# Patient Record
Sex: Male | Born: 1940 | Race: White | Hispanic: No | Marital: Married
Health system: Midwestern US, Community
[De-identification: ages and names within clinical notes are randomized; demographics above are authoritative.]

## PROBLEM LIST (undated history)

## (undated) DIAGNOSIS — C4490 Unspecified malignant neoplasm of skin, unspecified: Secondary | ICD-10-CM

## (undated) DIAGNOSIS — Z22322 Carrier or suspected carrier of Methicillin resistant Staphylococcus aureus: Secondary | ICD-10-CM

## (undated) DIAGNOSIS — G4730 Sleep apnea, unspecified: Secondary | ICD-10-CM

## (undated) DIAGNOSIS — M1990 Unspecified osteoarthritis, unspecified site: Secondary | ICD-10-CM

## (undated) DIAGNOSIS — E7800 Pure hypercholesterolemia, unspecified: Secondary | ICD-10-CM

## (undated) DIAGNOSIS — L0291 Cutaneous abscess, unspecified: Secondary | ICD-10-CM

## (undated) DIAGNOSIS — H269 Unspecified cataract: Secondary | ICD-10-CM

## (undated) DIAGNOSIS — I10 Essential (primary) hypertension: Secondary | ICD-10-CM

## (undated) DIAGNOSIS — Z923 Personal history of irradiation: Secondary | ICD-10-CM

## (undated) DIAGNOSIS — Z86010 Personal history of colonic polyps: Secondary | ICD-10-CM

## (undated) DIAGNOSIS — L02419 Cutaneous abscess of limb, unspecified: Secondary | ICD-10-CM

## (undated) HISTORY — DX: Cutaneous abscess of limb, unspecified: L02.419

## (undated) HISTORY — DX: Sleep apnea, unspecified: G47.30

## (undated) HISTORY — DX: Personal history of irradiation: Z92.3

## (undated) HISTORY — PX: EYE SURGERY: SHX253

## (undated) HISTORY — PX: APPENDECTOMY: SHX54

## (undated) HISTORY — DX: Pure hypercholesterolemia, unspecified: E78.00

## (undated) HISTORY — DX: Personal history of colonic polyps: Z86.010

## (undated) HISTORY — DX: Unspecified cataract: H26.9

## (undated) HISTORY — PX: BACK SURGERY: SHX140

## (undated) HISTORY — PX: COLONOSCOPY: SHX174

## (undated) HISTORY — DX: Morbid (severe) obesity due to excess calories: E66.01

## (undated) HISTORY — DX: Unspecified osteoarthritis, unspecified site: M19.90

## (undated) HISTORY — PX: REPLACEMENT TOTAL KNEE: SUR1224

## (undated) HISTORY — PX: TONSILLECTOMY: SUR1361

---

## 2002-03-16 ENCOUNTER — Encounter: Payer: Self-pay | Admitting: Emergency Medicine

## 2002-03-16 ENCOUNTER — Emergency Department (HOSPITAL_COMMUNITY): Admission: EM | Admit: 2002-03-16 | Discharge: 2002-03-16 | Payer: Self-pay

## 2002-08-10 ENCOUNTER — Encounter: Payer: Self-pay | Admitting: Family Medicine

## 2002-08-10 ENCOUNTER — Encounter: Admission: RE | Admit: 2002-08-10 | Discharge: 2002-08-10 | Payer: Self-pay | Admitting: Family Medicine

## 2002-09-01 ENCOUNTER — Ambulatory Visit (HOSPITAL_BASED_OUTPATIENT_CLINIC_OR_DEPARTMENT_OTHER): Admission: RE | Admit: 2002-09-01 | Discharge: 2002-09-01 | Payer: Self-pay | Admitting: Family Medicine

## 2003-07-15 ENCOUNTER — Encounter: Admission: RE | Admit: 2003-07-15 | Discharge: 2003-07-15 | Payer: Self-pay | Admitting: Family Medicine

## 2004-02-22 ENCOUNTER — Ambulatory Visit: Payer: Self-pay | Admitting: Internal Medicine

## 2004-03-05 ENCOUNTER — Ambulatory Visit: Payer: Self-pay | Admitting: Internal Medicine

## 2004-09-23 ENCOUNTER — Emergency Department (HOSPITAL_COMMUNITY): Admission: EM | Admit: 2004-09-23 | Discharge: 2004-09-23 | Payer: Self-pay | Admitting: Emergency Medicine

## 2004-10-26 ENCOUNTER — Ambulatory Visit (HOSPITAL_COMMUNITY): Admission: RE | Admit: 2004-10-26 | Discharge: 2004-10-26 | Payer: Self-pay | Admitting: Orthopedic Surgery

## 2004-10-26 ENCOUNTER — Ambulatory Visit (HOSPITAL_BASED_OUTPATIENT_CLINIC_OR_DEPARTMENT_OTHER): Admission: RE | Admit: 2004-10-26 | Discharge: 2004-10-26 | Payer: Self-pay | Admitting: Orthopedic Surgery

## 2004-12-12 HISTORY — PX: KNEE ARTHROPLASTY: SHX992

## 2004-12-24 ENCOUNTER — Inpatient Hospital Stay (HOSPITAL_COMMUNITY): Admission: RE | Admit: 2004-12-24 | Discharge: 2004-12-28 | Payer: Self-pay | Admitting: Orthopedic Surgery

## 2005-05-08 ENCOUNTER — Encounter: Admission: RE | Admit: 2005-05-08 | Discharge: 2005-05-08 | Payer: Self-pay | Admitting: Family Medicine

## 2005-05-10 ENCOUNTER — Encounter: Admission: RE | Admit: 2005-05-10 | Discharge: 2005-05-10 | Payer: Self-pay | Admitting: Family Medicine

## 2006-08-05 ENCOUNTER — Encounter: Admission: RE | Admit: 2006-08-05 | Discharge: 2006-08-05 | Payer: Self-pay | Admitting: Family Medicine

## 2006-09-03 IMAGING — CR DG ABDOMEN 1V
2 series · 2 of 2 positions shown · non-contrast
Comparison: None.

CLINICAL DATA: Ileus and constipation after left knee surgery.
 SINGLE VIEW ABDOMEN:

[view not recorded (1 of 2)]
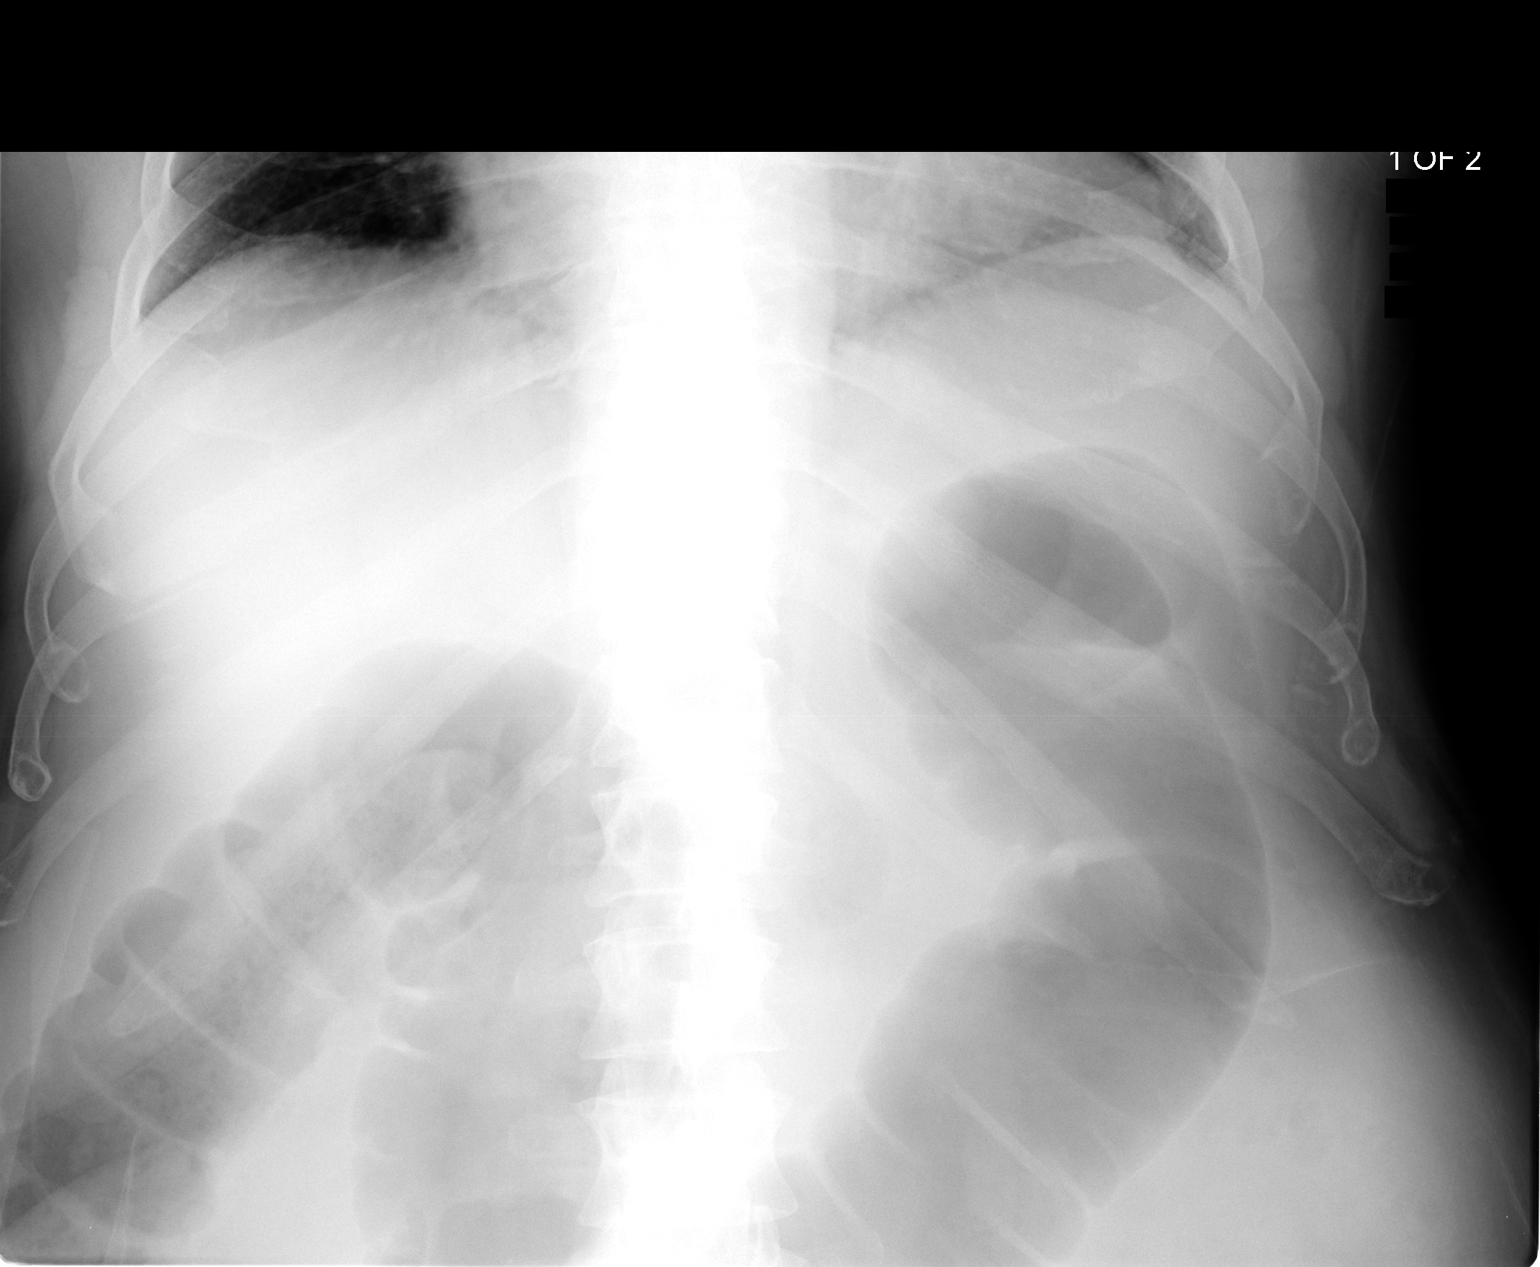

[view not recorded (2 of 2)]
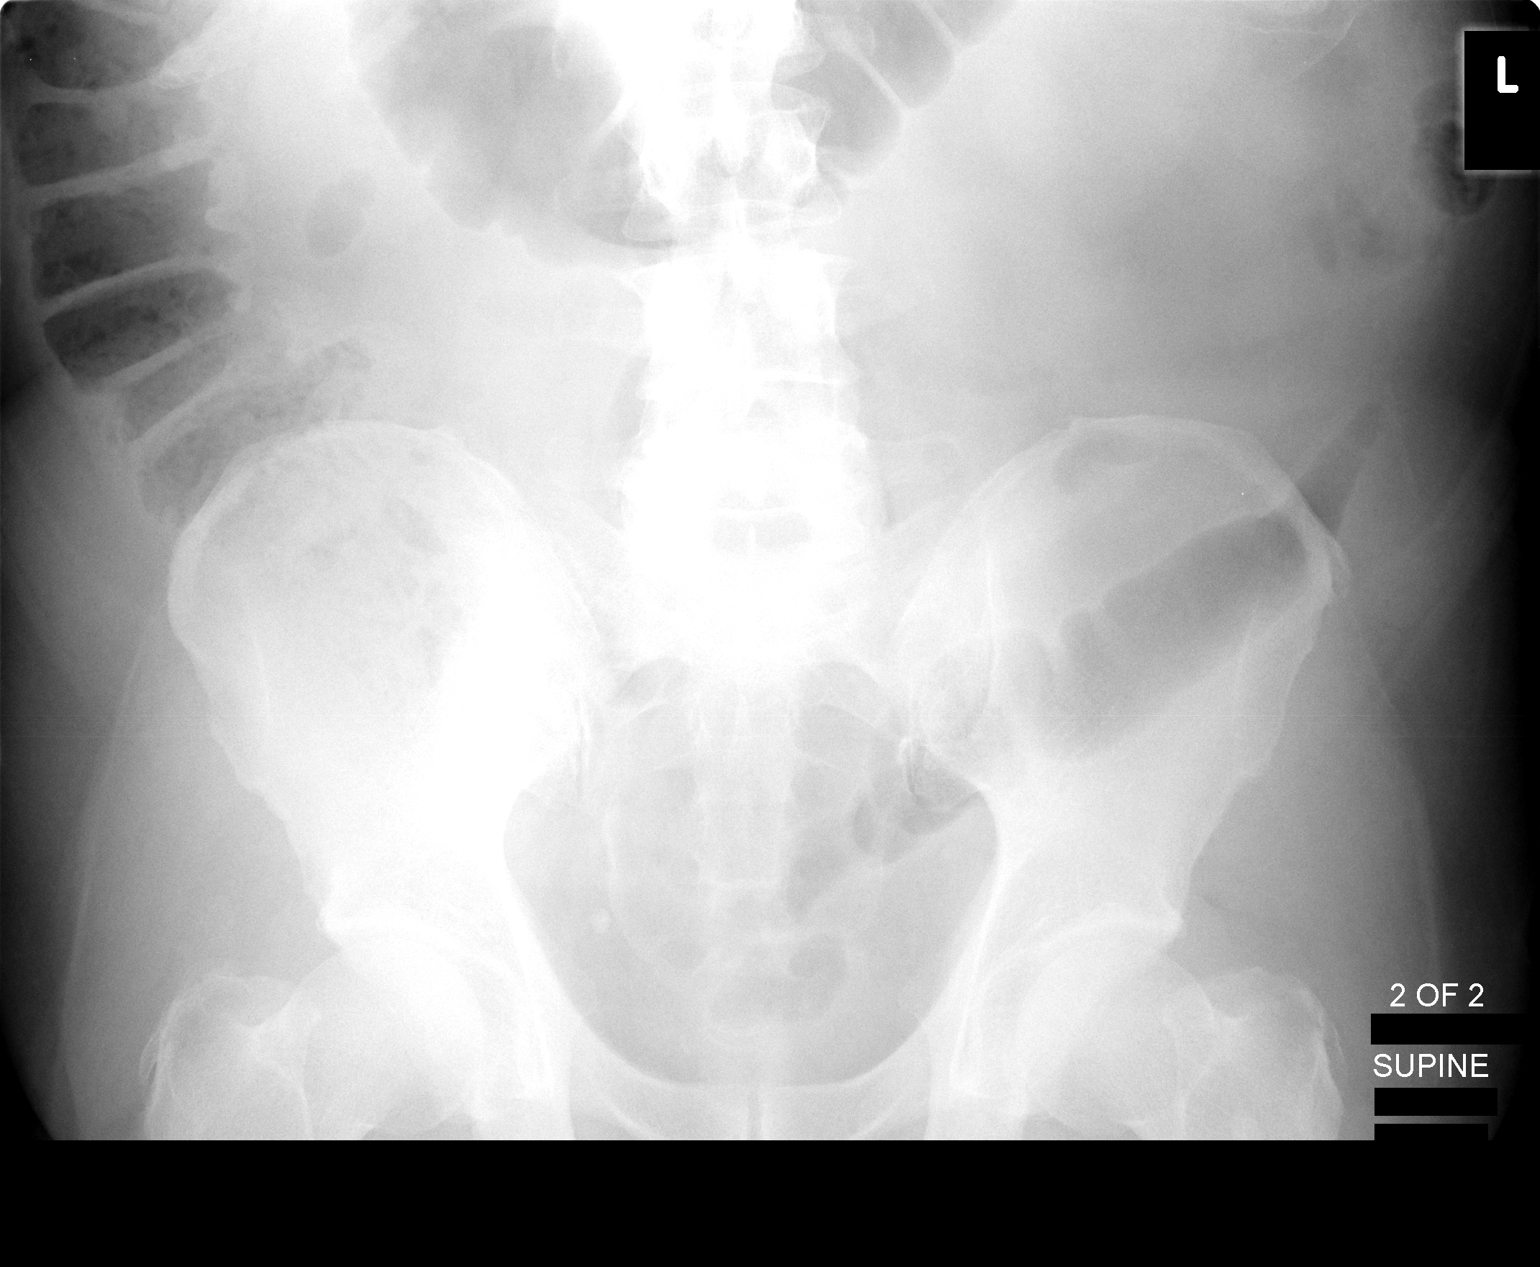

[2 of 2 positions shown; findings below may reference images not displayed]

FINDINGS: Supine films were obtained portably to encompass the entire abdomen.  There is moderate gaseous dilatation of the colon consistent with ileus.  No small bowel dilatation is seen.
IMPRESSION: Moderate postoperative colonic ileus.

## 2006-12-17 ENCOUNTER — Inpatient Hospital Stay (HOSPITAL_COMMUNITY): Admission: RE | Admit: 2006-12-17 | Discharge: 2006-12-23 | Payer: Self-pay | Admitting: Neurosurgery

## 2008-05-18 ENCOUNTER — Encounter: Admission: RE | Admit: 2008-05-18 | Discharge: 2008-05-18 | Payer: Self-pay | Admitting: Family Medicine

## 2008-08-23 IMAGING — CR DG LUMBAR SPINE 1V
1 series · 1 of 1 positions shown · non-contrast
Comparison: none

CLINICAL DATA: L4-5 discectomy.  
 PORTABLE LATERAL LUMBAR SPINE ? 1 VIEW:

[view not recorded]
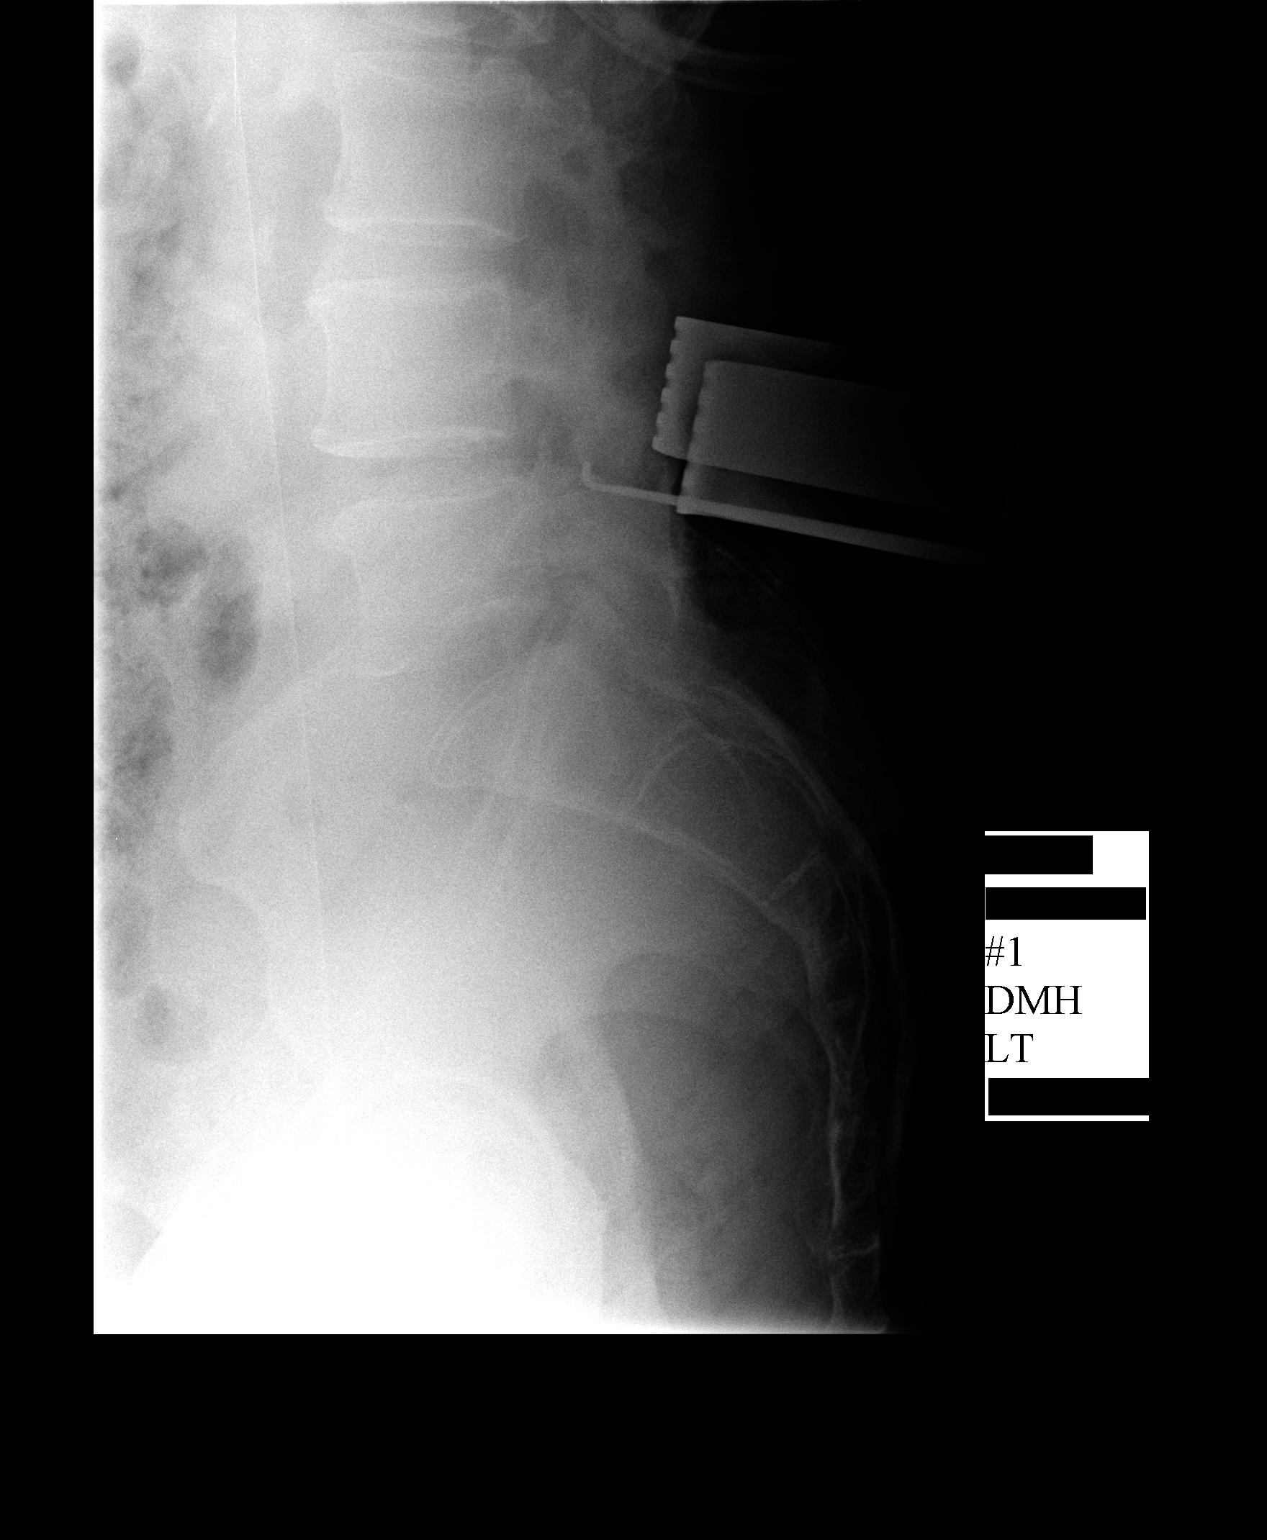

[1 of 1 positions shown; findings below may reference images not displayed]

FINDINGS: A single film shows tissue spreaders in place posteriorly with the probe directed at the L4-4 disc space level.
IMPRESSION: As discussed above.

## 2009-02-11 HISTORY — PX: ROTATOR CUFF REPAIR: SHX139

## 2009-02-18 ENCOUNTER — Emergency Department (HOSPITAL_COMMUNITY): Admission: EM | Admit: 2009-02-18 | Discharge: 2009-02-19 | Payer: Self-pay | Admitting: Emergency Medicine

## 2009-03-11 ENCOUNTER — Encounter: Admission: RE | Admit: 2009-03-11 | Discharge: 2009-03-11 | Payer: Self-pay | Admitting: Orthopedic Surgery

## 2009-05-11 ENCOUNTER — Ambulatory Visit (HOSPITAL_COMMUNITY): Admission: RE | Admit: 2009-05-11 | Discharge: 2009-05-12 | Payer: Self-pay | Admitting: Orthopedic Surgery

## 2010-05-02 LAB — CBC
MCV: 89.4 fL (ref 78.0–100.0)
Platelets: 164 10*3/uL (ref 150–400)
WBC: 9.4 10*3/uL (ref 4.0–10.5)

## 2010-05-02 LAB — BASIC METABOLIC PANEL
Chloride: 102 mEq/L (ref 96–112)
GFR calc non Af Amer: >60 mL/min (ref >60)
Potassium: 3.5 mEq/L (ref 3.5–5.1)
Sodium: 135 mEq/L (ref 135–145)

## 2010-05-02 LAB — GLUCOSE, CAPILLARY: Glucose-Capillary: 159 mg/dL — ABNORMAL HIGH (ref 70–99)

## 2010-05-06 LAB — URINALYSIS, ROUTINE W REFLEX MICROSCOPIC
Bilirubin Urine: NEGATIVE
Glucose, UA: NEGATIVE mg/dL
Hgb urine dipstick: NEGATIVE
Ketones, ur: NEGATIVE mg/dL
Nitrite: NEGATIVE
Specific Gravity, Urine: 1.022 (ref 1.005–1.030)
pH: 5.5 (ref 5.0–8.0)

## 2010-05-06 LAB — CBC
HCT: 43.9 % (ref 39.0–52.0)
Hemoglobin: 14.7 g/dL (ref 13.0–17.0)
RBC: 4.84 MIL/uL (ref 4.22–5.81)

## 2010-05-06 LAB — BASIC METABOLIC PANEL
BUN: 14 mg/dL (ref 6–23)
CO2: 25 mEq/L (ref 19–32)
Calcium: 9.5 mg/dL (ref 8.4–10.5)
Chloride: 107 mEq/L (ref 96–112)
GFR calc Af Amer: >60 mL/min (ref >60)
GFR calc non Af Amer: >60 mL/min (ref >60)
Glucose, Bld: 144 mg/dL — ABNORMAL HIGH (ref 70–99)
Potassium: 4 mEq/L (ref 3.5–5.1)

## 2010-05-06 LAB — PROTIME-INR
INR: 0.94 (ref 0.00–1.49)
Prothrombin Time: 12.5 seconds (ref 11.6–15.2)

## 2010-05-06 LAB — GLUCOSE, CAPILLARY: Glucose-Capillary: 151 mg/dL — ABNORMAL HIGH (ref 70–99)

## 2010-05-06 LAB — DIFFERENTIAL
Lymphs Abs: 2.1 10*3/uL (ref 0.7–4.0)
Monocytes Absolute: 0.9 10*3/uL (ref 0.1–1.0)
Neutrophils Relative %: 60 % (ref 43–77)

## 2010-05-06 LAB — APTT: aPTT: 26 seconds (ref 24–37)

## 2010-06-26 NOTE — Discharge Summary (Signed)
Figueroa, Jerry               ACCOUNT NO.:  [id number]   MEDICAL RECORD NO.:  [id number]          PATIENT TYPE:  INP   LOCATION:  3001                         FACILITY:  [facility]   PHYSICIAN:  Hilda Lias, M.D.   DATE OF BIRTH:  02/11/1941   DATE OF ADMISSION:  12/17/2006  DATE OF DISCHARGE:  12/18/2006                               DISCHARGE SUMMARY   ADMISSION DIAGNOSIS:  L4-L5 spondylolisthesis with chronic  radiculopathy.   FINAL DIAGNOSIS:  L4-L5 spondylolisthesis with chronic radiculopathy.   CLINICAL HISTORY:  The patient was admitted because with radiation to  both legs.  X-rays show a spondylolisthesis at the L4-5.  Previously  this gentleman was to have surgery by somebody else.   HOSPITAL COURSE:  Normal course in the hospital.  The patient was taken  to surgery for an L4-5 diskectomy, followed by fusion was done.  Today,  he is feeling much better.  He has been ambulating.  He is ready to go  home.   CONDITION ON DISCHARGE:  Improving.   MEDICATIONS:  Percocet and  Diazepam.   DIET:  Regular.   ACTIVITY:  Not to do any heavy lifting.  No to drive for, at least, 2  weeks.   FOLLOWUP:  He will be seen by me in my office in 4 weeks or before as  needed.           ______________________________  Hilda Lias, M.D.     EB/MEDQ  D:  12/23/2006  T:  12/23/2006  Job:  [id number]

## 2010-06-26 NOTE — H&P (Signed)
Jerry Figueroa, PRUDE               ACCOUNT NO.:  [id number]   MEDICAL RECORD NO.:  [id number]          PATIENT TYPE:  INP   LOCATION:  3001                         FACILITY:  [facility]   PHYSICIAN:  Hilda Lias, M.D.   DATE OF BIRTH:  Jun 29, 1940   DATE OF ADMISSION:  12/17/2006  DATE OF DISCHARGE:                              HISTORY & PHYSICAL   HISTORY OF PRESENT ILLNESS:  Mr. Landin is a gentleman who came to see  me about 8 weeks ago complaining of back pain with radiation to the left  leg, which has been going on for almost 6 months.  The pain is mostly  associated with mobility.  When he stands up the pain shoots all the way  down the to the left leg.  The patient was seen by a new surgeon who  advised him to have an effusion.  Nevertheless, he came to [place] for a  second opinion and he wants [place] to proceed with surgery.   PAST MEDICAL HISTORY:  1. Eye surgery.  2. Tonsillectomy.  3. Knee replacement.  4. Appendectomy.   SOCIAL HISTORY:  He drinks socially.  He does not smoke.   FAMILY HISTORY:  Negative.   REVIEW OF SYSTEMS:  Positive for back pain, leg pain, diabetes and high  blood pressure.   PHYSICAL EXAMINATION:  GENERAL:  The patient came to my office and he  was walking without flexion of the lumbar spine to minimize the pain.  He was holding the left leg with the left arm.  He said it was quite  painful.  HEENT:  Normal.  NECK:  Normal.  LUNGS:  Clear.  HEART:  Sounds normal.  ABDOMEN:  Normal.  EXTREMITIES:  Normal pulses.  NEURO:  Shows that he has weakness of the left leg at the level of the  dorsiflexion and difficult to assess his strength because of the pain  that he is having.  Straight leg raising (SLR)is positive bilateral  around 60 degrees.   The MRI showed that he has grade 1 spondylolisthesis at the level 4 and  5, which gets worse with flexion.   CLINICAL IMPRESSION:  L4-5 spondylolisthesis grade 1 with stenosis.   RECOMMENDATIONS:  The  patient is being admitted for surgery.  The  procedure would be an L4-5 diskectomy, interbody fusion, pedicle screws  with BMP and autograft.The patient told about the risks of infection,  CSF leak, worsening pain, damage to the vessels and the need for further  surgery and failure of the hardware.           ______________________________  Hilda Lias, M.D.     EB/MEDQ  D:  12/17/2006  T:  12/17/2006  Job:  [id number]

## 2010-06-26 NOTE — Op Note (Signed)
NAMEGOTTFRIED, STANDISH               ACCOUNT NO.:  [id number]   MEDICAL RECORD NO.:  [id number]          PATIENT TYPE:  INP   LOCATION:  3172                         FACILITY:  [facility]   PHYSICIAN:  Hilda Lias, M.D.   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   PROCEDURE:  Bilateral L4 laminectomy, facetectomy, diskectomy  bilaterally, interbody fusion with cage, pedicle screws L4-5,  posterolateral arthrodesis with bone morphogenic protein and autograft.  Cell Saver, C-arm.   SURGEON:  Hilda Lias, M.D.   ASSISTANT:  Clydene Fake, M.D.   CLINICAL HISTORY:  The patient had been complaining back pain with  radiation to both legs, left worse than right one.  This problem had  been going on for several months.  X-rays showed that he has stenosis at  the level of 4-5 with a herniated disk at 4-5 to the left.  Surgery was  advised.  The risks were explained to him in history.   PROCEDURE:  The patient was taken to the OR and he was positioned in  prone manner.  The skin was cleaned with DuraPrep.  Drapes were applied.  A midline incision between 4-5 was made.  Muscles were retracted  laterally until we found the junction between the facet and transverse  process of 4-5.  X-rays showed that indeed we were at the 4-5.  From  then on we removed the spinous process of L4 and we did a bilateral  laminectomy.  We did a partial facetectomy, but to enter into the disk  space, we had to do a complete facetectomy to reach laterally the disk.  Having done this in the right side we incised the disk and large amount  degenerative disk was removed.  In the left side we  found the same  finding but there was a herniated disk subligamentous  going into the  nerve of five.  Removal was achieved.  After having good diskectomy the  endplate were removed.  Then two cages of 12 x 26 with BMP and autograft  inside were inserted.  The rest of the disk was packed with BMP distally  and proximally to the dura mater autograft.  Then with the C-arm first  in AP view and then a lateral view, we identified the pedicle of L4, L5.  The pedicle were probed and a tap was used and in the end we were able  to insert four pedicle screws of 6.5 x 50.  There was kept in place  using a rod with caps on top.  Then we went laterally and we removed  periosteum of the 4-5 facet laterally as well as the transverse process.  A mix of BMP and autograft was used for the fusion.  From then on the  area was irrigated.  Valsalva maneuver was negative.  The  wound was  closed with Vicryl and Steri-Strips.           ______________________________  Hilda Lias, M.D.    EB/MEDQ  D:  12/17/2006  T:  12/18/2006  Job:  [id number]

## 2010-06-29 NOTE — Op Note (Signed)
[name], Jerry Figueroa               ACCOUNT NO.:  [id number]   MEDICAL RECORD NO.:  [id number]          PATIENT TYPE:  AMB   LOCATION:  NESC                         FACILITY:  [facility]   PHYSICIAN:  Marlowe Kays, M.D.  DATE OF BIRTH:  1941-01-14   DATE OF PROCEDURE:  10/26/2004  DATE OF DISCHARGE:                                 OPERATIVE REPORT   PREOPERATIVE DIAGNOSES:  1.  Torn medial meniscus.  2.  Osteoarthritis, right knee.   POSTOPERATIVE DIAGNOSES:  1.  Torn medial meniscus.  2.  Osteoarthritis, right knee.   OPERATION:  Right knee arthroscopy with:  1.  Partial medial meniscectomy.  2.  Debridement of the medial femoral condyle and the patella.  3.  Removal of loose body, medial joint.   SURGEON:  Dr. Simonne Come   ASSISTANT:  Nurse.   ANESTHESIA:  General.   PATHOLOGY AND JUSTIFICATION FOR PROCEDURE:  Because of pain and swelling in  his right knee and the feeling of some locking, he had an MRI performed on  October 02, 2004 which showed an oblique tear reaching the undersurface and  the body of the medial meniscus as well as osteoarthritis medially and the  patellofemoral joint.  These findings were all confirmed at surgery as well  as a loose body in the anterior medial joint.   PROCEDURE:  Satisfactory general anesthesia, pneumatic tourniquet.  Leg was  esmarched nonsterilely.  Thigh stabilizer.  Right leg was prepped with  DuraPrep from stabilizer to ankle with DuraPrep and draped in a sterile  field.  Ace wrap and leg support for left knee.  Superior medial saline  inflow.  First through an anterolateral portal of the medial compartment the  knee joint was evaluated.  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  All these were managed by using the 3.5 shaver to  perform synovectomy, remove the loose body, and debride down the medial  femoral condyle.   Posteriorly the fairly extensive medial meniscus tear I  managed with resection with small baskets and then shaving down until smooth  with a 3.5 shaver until the meniscus was nice and smooth and stable.  Looking up in the medial gutter and suprapatellar area, some wear on the  patella was appreciated and shaved.  I then reversed portals.  Looking in  the lateral joint, there was some mild synovitis which I resected.  The  meniscus looked normal.  The knee joint was then irrigated until clear and  all fluid possible removed.  The anterior portals were closed with 4-0  nylon, and 20 mL of 0.5% Marcaine with adrenalin and 4 mg of morphine were  then instilled through the inflow  apparatus which was removed and this portal closed with 4-0 nylon as well.  Betadine, Adaptic, dry sterile dressing were applied.  Tourniquet was  released.  At the time of this dictation, he was on his way to the recovery  room in satisfactory condition with no known complication.  ______________________________  Marlowe Kays, M.D.     JA/MEDQ  D:  10/26/2004  T:  10/26/2004  Job:  [id number]

## 2010-06-29 NOTE — H&P (Signed)
Jerry Figueroa NO.:  [id number]   MEDICAL RECORD NO.:  [id number]          PATIENT TYPE:  INP   LOCATION:  1514                         FACILITY:  [facility]   PHYSICIAN:  Ollen Gross, M.D.    DATE OF BIRTH:  [birth year]   DATE OF ADMISSION:  12/24/2004  DATE OF DISCHARGE:                                HISTORY & PHYSICAL   Date of office visit history and physical, December 13, 2004.   CHIEF COMPLAINT:  Left knee pain.   HISTORY OF PRESENT ILLNESS:  The patient is a 69 year old male who is seen  by Ollen Gross, M.D. for ongoing left knee pain.  He has had pain for  several years now and it has progressively gotten worse.  This is activity-  related and this caused some sharp pain and discomfort with movement.  He  was seen in the office where x-ray shows significant end-stage arthritic  changes of the left knee with bone on bone and about an 8 to 10 degree varus  deformity malalignment.  It was felt due to the significant pain and  deformity, he would benefit from undergoing a knee replacement.  Risks and  benefits have been discussed.  The patient is subsequently admitted to the  hospital.   ALLERGIES:  No known drug allergies.   CURRENT MEDICATIONS:  1.  Sular 30 mg daily.  2.  Quinaretic 20/12.5 daily.  3.  Diclofenac 75 mg twice a day.  4.  Ultimate HA formula daily.  5.  Aspirin 81 mg daily.  6.  Simvastatin 40 mg daily.  7.  Uvadex 5% topical application to nose b.i.d. for 3 weeks.   PAST MEDICAL HISTORY:  Sleep apnea which he uses a CPAP, hypertension,  elevated cholesterol, diet controlled diabetes, and history of skin cancer.   PAST SURGICAL HISTORY:  Right knee arthroscopy, eye surgery, tonsillectomy,  and appendectomy.   FAMILY HISTORY:  Mother is deceased at age 32 with rheumatic heart disease.  Father is deceased at age 64 with history of MI and heart disease.   SOCIAL HISTORY:  Married, four children two of which are living and two  of  which are deceased.  Denies the use of tobacco products and occasional beer.   REVIEW OF SYSTEMS:  GENERAL:  No fevers, chills, or night sweats.  NEUROLOGY:  No seizures, syncope, or paralysis.  RESPIRATORY:  No shortness  of breath, productive cough, or hemoptysis.  CARDIOVASCULAR:  No chest pain,  angina, or orthopnea.  GASTROINTESTINAL:  No nausea, vomiting, diarrhea, or  constipation.  GENITOURINARY:  No dysuria, hematuria, or discharge.  MUSCULOSKELETAL:  Left knee, in the history of present illness.   PHYSICAL EXAMINATION:  VITAL SIGNS:  Pulse 68, respirations 12, blood  pressure 130/84.  GENERAL:  A 70 year old white male, well-nourished, well-developed, in no  acute distress, overweight, alert and oriented and cooperative.  Appears to  be a good historian.  He is accompanied by his wife.  HEENT:  Normocephalic and atraumatic.  Pupils equal, round, and reactive to  light.  Oropharynx is clear.  EOM's are intact.  NECK:  Supple with no bruits.  CHEST:  Clear anterior and posterior chest walls.  HEART:  Regular rate and rhythm with no murmur.  ABDOMEN:  Round protuberant abdomen.  Bowel sounds are present.  RECTAL:  BREASTS:  GENITOURINARY:  Not done, not pertinent to present  illness.  EXTREMITIES:  Left knee shows a range of motion of 10 to 110 degrees,  moderate crepitus and no varus deformity.  Tender medially.   IMPRESSION:  1.  Osteoarthritis, left knee.  2.  Sleep apnea.  3.  Hypertension.  4.  Hypercholesterolemia.  5.  Diet controlled diabetes mellitus.  6.  History of skin cancer.   PLAN:  The patient will be admitted to [facility] to  undergo a left total knee arthroplasty.  Surgery will be performed by Dr.  Lequita Halt.      Jerry Figueroa, P.A.      Ollen Gross, M.D.  Electronically Signed    ALP/MEDQ  D:  12/27/2004  T:  12/28/2004  Job:  [id number]   cc:   Donia Guiles, M.D.  Fax: [phone]

## 2010-06-29 NOTE — Op Note (Signed)
NAMECAYLOR, TALLARICO               ACCOUNT NO.:  [id number]   MEDICAL RECORD NO.:  [id number]          PATIENT TYPE:  INP   LOCATION:  1514                         FACILITY:  [facility]   PHYSICIAN:  Ollen Gross, M.D.    DATE OF BIRTH:  [birth year]   DATE OF PROCEDURE:  12/24/2004  DATE OF DISCHARGE:                                 OPERATIVE REPORT   PREOPERATIVE DIAGNOSIS:  Osteoarthritis, left knee.   POSTOPERATIVE DIAGNOSIS:  Osteoarthritis, left knee.   PROCEDURE:  Left total knee arthroplasty.   SURGEON:  Ollen Gross, M.D.   ASSISTANT:  Avel Peace, PA-C.   ANESTHESIA:  General with postop Marcaine pain pump.   ESTIMATED BLOOD LOSS:  Minimal.   DRAINS:  Hemovac x1.   TOURNIQUET TIME:  Forty-two minutes at 300 mmHg.   COMPLICATIONS:  None.   CONDITION:  Stable to the recovery room.   CLINICAL NOTE:  Mr. Mathews is a 70 year old male with severe end-stage  osteoarthritis of the left knee with intractable pain.  He presents now for  left total knee arthroplasty.   PROCEDURE IN DETAIL:  After successful administration of general anesthetic,  a tourniquet is placed high, and the left lower extremity prepped and draped  in the usual sterile fashion.  The extremity is wrapped in esmarch, the knee  flexed, and the tourniquet inflated to 300 mmHg.  A standard midline  incision was made with a 10 blade through the subcutaneous tissue to the  level of the extensor mechanism.  A fresh blade is used to make a medial  parapatellar arthrotomy, and the soft tissue over the proximal medial tibia  is subperiosteally elevated to the joint line with a knife and into the  semimembranosus bursa with a Cobb elevator.  The soft tissue over the  proximal lateral tibia is also elevated with attention being paid to avoid  the patellar tendon on the tibial tubercle.  The patella is everted, and the  knee flexed to 90 degrees.  ACL and PCL removed.  The drill is used to  create a starting hole  in the distal femur, and the canal is irrigated.  A 5  degree left valgus alignment guide is placed, and rotating off the posterior  condyle, rotation is marked with a block pin to remove 10 mm off the distal  femur.  Distal femoral resection is made with an oscillating saw.  A sizing  block is placed, and size 5 is most appropriate for the femur.  Rotation is  marked off the epicondylar axis.  A size 5 cutting block is placed, and the  anterior and posterior and chamfer cuts are made.   The tibia is subluxed forward, and the menisci are removed.  An  extramedullary tibial alignment guide is placed, referencing proximally at  the medial aspect of the tibial tubercle, and distally along the second  metatarsal axis of the tibial crest.  The block is pinned to remove 10 mm  off the nondeficient lateral side.  Tibial resection is made with an  oscillating saw.  The size 4 is also the most  appropriate tibial component,  and the proximal tibia is prepared with a modular drill and keel punch for a  size 4.  Femoral preparation is completed with the intercondylar cut.   A size 5 posterior stabilized femoral trial with a size 4 mobile-bearing  tibial trial and a 10 mm posterior stabilized rotating platform insert trial  was placed.  With the 10, full extension is achieved with excellent varus  and valgus balance throughout with full range of motion.  The patella is  then everted, and thickness measured to be 24 mm.  Free-hand resection is  taken to 13 mm.  A 41 template is placed.  Lug holes are drilled.  The trial  patella is placed, and it tracks normally.  The osteophytes are then removed  off the posterior femur with a trial in place.  All trials were removed,  then the cut-bone surfaces are prepared with pulsatile lavage.  Cement is  mixed, and once ready for implantation, a size 4 mobile-bearing tibial  trial, size 5 posterior stabilized femur, and 41 patella are cemented into  place.  The  patella is held with a clamp.  A trial 10 mm insert is placed.  The knee held in full extension, and all extruded cement is removed.  Once  the cement is fully hardened, then the permanent 10 mm posterior stabilized  rotating platform insert is placed into the tibial tray.  The wound is  copiously irrigated with saline solution.  The extensor mechanism closed  over a Hemovac drain with interrupted #1 PDS.  Flexion against gravity is  135 degrees.  The tourniquet is released for a total time of 42 minutes.  The subcu is closed with interrupted 2-0 Vicryl and the subcuticular with  running 4-0 Monocryl.  The incision is cleaned and dried, and Steri-Strips  and a bulky sterile dressing applied.  The Hemovac is hooked to suction.  He  is placed into a knee immobilizer, awakened and transported to recovery in  stable condition.      Ollen Gross, M.D.  Electronically Signed     FA/MEDQ  D:  12/24/2004  T:  12/24/2004  Job:  [id number]

## 2010-06-29 NOTE — Discharge Summary (Signed)
Jerry Figueroa, Jerry Figueroa               ACCOUNT NO.:  [id number]   MEDICAL RECORD NO.:  [id number]          PATIENT TYPE:  INP   LOCATION:  1514                         FACILITY:  [facility]   PHYSICIAN:  Ollen Gross, M.D.    DATE OF BIRTH:  06-Dec-1940   DATE OF ADMISSION:  12/24/2004  DATE OF DISCHARGE:  12/28/2004                                 DISCHARGE SUMMARY   ADMISSION DIAGNOSES:  1.  Osteoarthritis left knee.  2.  Sleep apnea.  3.  Hypertension.  4.  Hypercholesterolemia.  5.  Diet controlled diabetes mellitus.  6.  History of skin cancer.   DISCHARGE DIAGNOSES:  1.  Osteoarthritis left knee status post left total knee arthroplasty.  2.  Postoperative blood loss anemia that did not require transfusion.  3.  Postoperative hyponatremia, improved.  4.  Postoperative hypokalemia, improved.  5.  Postoperative ileus, improved.  6.  Sleep apnea.  7.  Hypertension.  8.  Hypercholesterolemia.  9.  Diet controlled diabetes mellitus.  10. History of skin cancer.   PROCEDURE:  December 24, 2004, left total knee arthroplasty; surgeon, Dr.  Ollen Gross; assistant, Avel Peace, PA-C; anesthesia general, postop  Marcaine pain pump. Tourniquet time 42 minutes.   CONSULTATIONS:  None.   BRIEF HISTORY:  Mr. Gentle is a 70 year old male with severe end-stage  arthritis of the left knee with intractable pain who now presents for total  knee arthroplasty.   LABORATORY DATA:  Preop CBC, hemoglobin 14.8, hematocrit 42.3. Differential  normal except elevated monos of 13. Postop hemoglobin 11.6, last noted H&H  10.5 and 30.2. PT and PTT preop 13.1 and 32 respectively. INR 1.0. Serial  pro times followed. Last noted PT/INR 20.2 and 1.7. Chem panel on admission  slightly elevated glucose of 128, remaining chem panel all within normal  limits. Serial BMETs are followed. Sodium dropped from 130 down to 134 back  up to 137, potassium dropped to 3.9, actually went up initially from 3.9 to  5.8  and then went low to 3.4 and came up to 3.6. Amylase and lipase taken on  December 27, 2004 showed levels of 15 and 16 respectively and were not  elevated. Preop UA, positive ketones otherwise negative, blood group type A  positive. EKG dated October 22, 2004, normal sinus rhythm, cannot rule out  inferior infarct, age undetermined. No previous EKG, confirmed by Willa Rough, M.D.  Two view chest December 18, 2004, no acute cardiopulmonary  disease. Abdominal film taken December 27, 2004, moderate postoperative  colonic ileus.   HOSPITAL COURSE:  Admitted to [facility], tolerated procedure  well, later transferred to the recovery room and then the orthopedic floor.  The first night was not too bad. He was doing pretty good on day 1, hemovac  drain pulled. Elevated potassium was noted, encouraged a CPAP because of his  sleep apnea. Serum glucose was elevated but it was on a sliding scale. The  D5 was removed. Monitored his BUN and creatinine. His blood pressure was a  little elevated but he was on his blood pressure pills. By day 2,  he was  doing much better. He did not have any complaints except he did have some  abdominal cramping. The elevated potassium which was noted on day 1 was  actually low down to a level of 3.2 so he was put on some mild K-Dur, a low  level of K-Dur. Due to his abdominal distention and the lack of flatus, he  was started on IV Reglan and Dulcolax suppository. He did have excellent  urinary output and his BUN and creatinine were normal. By day 3, the  abdominal discomfort had increased. He did not get any results with the  suppository, started with an enema. Checked a KUB which did show patterns  consistent with a colonic ileus. From a therapy standpoint with regards to  his knee, he actually did pretty well, he was able to get up and ambulate  100 feet by day 2, did not walk much on the morning of day 3 because of the  abdominal distention but once he  started getting up, he did ambulate 120  feet. He did get results with the Fleets enema and by the following day of  December 28, 2004, he was much improved clinically, he had excellent bowel  sounds and multiple movements. The abdominal cramping was greatly reduced.  It was felt to be all narcotic. He had been weaned off his PCA and over to  p.o. meds, encouraged him to stay on stool softeners and have laxatives on  hand. He was feeling much better ambulating around 120 feet and was  discharged home.   PLAN:  1.  The patient discharged home on December 28, 2004.  2.  Discharge diagnoses, please see above.  3.  Discharge meds:  Coumadin, Percocet and Robaxin. I also recommended over      the counter stool softener and laxatives on hand.   DIET:  Use a diabetic diet, low sodium diet.   ACTIVITY:  Home health PT and home health nursing, total knee protocol. May  start showering. Weightbearing as tolerated. Daily dressing changes.   FOLLOW UP:  Followup in two weeks, call the office for an appointment.   DISPOSITION:  Home.   CONDITION ON DISCHARGE:  Improved.      Jerry Figueroa, P.A.      Ollen Gross, M.D.  Electronically Signed    ALP/MEDQ  D:  01/28/2005  T:  01/30/2005  Job:  [id number]   cc:   Donia Guiles, M.D.  Fax: [phone]

## 2010-11-21 LAB — COMPREHENSIVE METABOLIC PANEL
ALT: 11
AST: 12
Albumin: 3.8
Calcium: 9.4
Chloride: 105
Creatinine, Ser: 0.71
GFR calc Af Amer: >60
Sodium: 142

## 2010-11-21 LAB — CBC
MCHC: 33.8
MCV: 88.8
Platelets: 220
WBC: 6.9

## 2010-11-21 LAB — TYPE AND SCREEN

## 2010-11-21 LAB — ABO/RH: ABO/RH(D): A POS

## 2010-12-09 ENCOUNTER — Inpatient Hospital Stay (INDEPENDENT_AMBULATORY_CARE_PROVIDER_SITE_OTHER)
Admission: RE | Admit: 2010-12-09 | Discharge: 2010-12-09 | Disposition: A | Discharge status: Home / Self Care | Payer: Medicare Other | Source: Ambulatory Visit | Attending: Family Medicine | Admitting: Family Medicine

## 2010-12-09 DIAGNOSIS — IMO0001 Reserved for inherently not codable concepts without codable children: Secondary | ICD-10-CM

## 2010-12-12 ENCOUNTER — Inpatient Hospital Stay (HOSPITAL_COMMUNITY)
Admission: AD | Admit: 2010-12-12 | Discharge: 2010-12-16 | DRG: 603 | Disposition: A | Discharge status: Home Health | Payer: Medicare Other | Source: Ambulatory Visit | Attending: General Surgery | Admitting: General Surgery

## 2010-12-12 ENCOUNTER — Encounter (INDEPENDENT_AMBULATORY_CARE_PROVIDER_SITE_OTHER): Payer: Self-pay | Admitting: General Surgery

## 2010-12-12 ENCOUNTER — Ambulatory Visit (INDEPENDENT_AMBULATORY_CARE_PROVIDER_SITE_OTHER): Payer: Medicare Other | Admitting: Surgery

## 2010-12-12 ENCOUNTER — Encounter (INDEPENDENT_AMBULATORY_CARE_PROVIDER_SITE_OTHER): Payer: Self-pay | Admitting: Surgery

## 2010-12-12 VITALS — BP 144/96 | HR 80 | Temp 97.8°F | Resp 16 | Ht 70.0 in | Wt 269.1 lb

## 2010-12-12 DIAGNOSIS — L02213 Cutaneous abscess of chest wall: Secondary | ICD-10-CM

## 2010-12-12 DIAGNOSIS — L03319 Cellulitis of trunk, unspecified: Principal | ICD-10-CM | POA: Diagnosis present

## 2010-12-12 DIAGNOSIS — I10 Essential (primary) hypertension: Secondary | ICD-10-CM | POA: Diagnosis present

## 2010-12-12 DIAGNOSIS — E119 Type 2 diabetes mellitus without complications: Secondary | ICD-10-CM | POA: Diagnosis present

## 2010-12-12 DIAGNOSIS — L0231 Cutaneous abscess of buttock: Secondary | ICD-10-CM | POA: Diagnosis present

## 2010-12-12 DIAGNOSIS — Z7982 Long term (current) use of aspirin: Secondary | ICD-10-CM

## 2010-12-12 DIAGNOSIS — A4902 Methicillin resistant Staphylococcus aureus infection, unspecified site: Secondary | ICD-10-CM | POA: Diagnosis present

## 2010-12-12 DIAGNOSIS — Z79899 Other long term (current) drug therapy: Secondary | ICD-10-CM

## 2010-12-12 DIAGNOSIS — L02219 Cutaneous abscess of trunk, unspecified: Principal | ICD-10-CM | POA: Diagnosis present

## 2010-12-12 HISTORY — PX: OTHER SURGICAL HISTORY: SHX169

## 2010-12-12 LAB — CBC
HCT: 38.6 % — ABNORMAL LOW (ref 39.0–52.0)
Hemoglobin: 12.9 g/dL — ABNORMAL LOW (ref 13.0–17.0)
RBC: 4.28 MIL/uL (ref 4.22–5.81)
WBC: 12 10*3/uL — ABNORMAL HIGH (ref 4.0–10.5)

## 2010-12-12 LAB — COMPREHENSIVE METABOLIC PANEL
Albumin: 3.4 g/dL — ABNORMAL LOW (ref 3.5–5.2)
Alkaline Phosphatase: 50 U/L (ref 39–117)
BUN: 16 mg/dL (ref 6–23)
CO2: 25 mEq/L (ref 19–32)
Chloride: 100 mEq/L (ref 96–112)
GFR calc Af Amer: >90 mL/min (ref >90)
Glucose, Bld: 109 mg/dL — ABNORMAL HIGH (ref 70–99)
Potassium: 3.8 mEq/L (ref 3.5–5.1)
Total Bilirubin: 0.6 mg/dL (ref 0.3–1.2)

## 2010-12-12 LAB — HEMOGLOBIN A1C: Hgb A1c MFr Bld: 6.8 % — ABNORMAL HIGH (ref <5.7)

## 2010-12-12 LAB — DIFFERENTIAL
Basophils Absolute: 0 10*3/uL (ref 0.0–0.1)
Lymphocytes Relative: 19 % (ref 12–46)
Neutro Abs: 8 10*3/uL — ABNORMAL HIGH (ref 1.7–7.7)

## 2010-12-12 NOTE — Progress Notes (Signed)
Subjective:     Patient ID: Jerry Figueroa, male   DOB: [birth year], 70 y.o.   MRN: [id number]  HPI 70 year old diabetic whose wife current has MRSA who presents with a right axillary abscess that is about 3" x 6". Dr. Clelia Croft had he go [facility] performed a I&D this morning but felt it was not complete. This afternoon I injected the area with lidocaine and extended the incision to about an inch transversely. I inserted my finger and gently followed the tissue planes and it is opened. There was bloody material coming out. His wife's abscess is resistant to doxycycline which he has been on. I'm going to admit him to [facility] for IV vancomycin and observation. Current Outpatient Prescriptions  Medication Sig Dispense Refill  . aspirin 81 MG tablet Take 81 mg by mouth daily.        . diclofenac (VOLTAREN) 75 MG EC tablet Take 75 mg by mouth 2 (two) times daily.        Marland Kitchen ezetimibe-simvastatin (VYTORIN) 10-40 MG per tablet Take 1 tablet by mouth at bedtime.        . metFORMIN (GLUCOPHAGE) 500 MG tablet Take 500 mg by mouth 2 (two) times daily with a meal.        . nebivolol (BYSTOLIC) 10 MG tablet Take 10 mg by mouth daily.        . quinapril-hydrochlorothiazide (ACCURETIC) 20-12.5 MG per tablet Take 1 tablet by mouth daily.        . traMADol (ULTRAM) 50 MG tablet Take 50 mg by mouth every 6 (six) hours as needed. Maximum dose= 8 tablets per day       . valACYclovir (VALTREX) 1000 MG tablet Take 1,000 mg by mouth 2 (two) times daily.         Past Medical History  Diagnosis Date  . Diabetes mellitus   . Hypercholesteremia   . Gout   . Sleep apnea   . Arthritis    Past Surgical History  Procedure Date  . Colonoscopy   . Appendectomy   . Rotator cuff repair       Review of Systems  Unable to perform ROS Constitutional: Positive for activity change.  HENT: Negative.   Eyes: Negative.   Respiratory: Negative.   Cardiovascular: Negative.    Filed Vitals:   12/12/10 1557  BP:  144/96  Pulse: 80  Temp: 97.8 F (36.6 C)  Resp: 16      Objective:     Physical Exam  Constitutional: He is oriented to person, place, and time.       obese  HENT:  Head: Normocephalic and atraumatic.  Eyes: Conjunctivae are normal. Pupils are equal, round, and reactive to light.  Neck: Normal range of motion.  Cardiovascular: Normal rate and regular rhythm.   Pulmonary/Chest:       Right axillary area of bright redness, well defined, abscess drained  Neurological: He is alert and oriented to person, place, and time.       Assessment:     Large right axillary MRSA abscess in chest      Plan:     Admit to [facility] for IV Vancomycin

## 2010-12-12 NOTE — Patient Instructions (Signed)
Report to [facility] for admission

## 2010-12-13 DIAGNOSIS — Z22322 Carrier or suspected carrier of Methicillin resistant Staphylococcus aureus: Secondary | ICD-10-CM

## 2010-12-13 HISTORY — DX: Carrier or suspected carrier of methicillin resistant Staphylococcus aureus: Z22.322

## 2010-12-14 LAB — CBC
Platelets: 165 10*3/uL (ref 150–400)
RBC: 4.23 MIL/uL (ref 4.22–5.81)
RDW: 13.7 % (ref 11.5–15.5)
WBC: 7.2 10*3/uL (ref 4.0–10.5)

## 2010-12-14 MED ORDER — NALOXONE HCL 0.4 MG/ML IJ SOLN: 0.4000 mg | INTRAMUSCULAR | Status: DC | PRN

## 2010-12-14 MED ORDER — DIPHENHYDRAMINE HCL 12.5 MG/5ML PO ELIX: 12.5000 mg | ORAL_SOLUTION | Freq: Four times a day (QID) | ORAL | Status: DC | PRN

## 2010-12-14 MED ORDER — ONDANSETRON HCL 4 MG/2ML IJ SOLN: 4.0000 mg | Freq: Four times a day (QID) | INTRAMUSCULAR | Status: DC | PRN

## 2010-12-14 MED ORDER — HYDROCHLOROTHIAZIDE 25 MG PO TABS
12.5000 mg | ORAL_TABLET | Freq: Every day | ORAL | Status: DC
  Administered 2010-12-16: 12.5 mg via ORAL
  Filled 2010-12-14 (×2): qty 0.5

## 2010-12-14 MED ORDER — LISINOPRIL 20 MG PO TABS
20.0000 mg | ORAL_TABLET | Freq: Every day | ORAL | Status: DC
  Administered 2010-12-16: 20 mg via ORAL
  Filled 2010-12-14 (×2): qty 1

## 2010-12-14 MED ORDER — NEBIVOLOL HCL 10 MG PO TABS
10.0000 mg | ORAL_TABLET | Freq: Every day | ORAL | Status: DC
  Administered 2010-12-16: 10 mg via ORAL
  Filled 2010-12-14 (×2): qty 1

## 2010-12-14 MED ORDER — SODIUM CHLORIDE 0.9 % IV SOLN: INTRAVENOUS | Status: DC | PRN

## 2010-12-14 MED ORDER — OXYCODONE-ACETAMINOPHEN 5-325 MG PO TABS: 1.0000 | ORAL_TABLET | Freq: Four times a day (QID) | ORAL | Status: DC | PRN

## 2010-12-14 MED ORDER — METFORMIN HCL 500 MG PO TABS
1000.0000 mg | ORAL_TABLET | Freq: Two times a day (BID) | ORAL | Status: DC
  Administered 2010-12-16: 1000 mg via ORAL
  Filled 2010-12-14 (×4): qty 2

## 2010-12-14 MED ORDER — HEPARIN SODIUM (PORCINE) 5000 UNIT/ML IJ SOLN
5000.0000 [IU] | Freq: Three times a day (TID) | INTRAMUSCULAR | Status: DC
  Administered 2010-12-16: 5000 [IU] via SUBCUTANEOUS
  Filled 2010-12-14 (×7): qty 1

## 2010-12-14 MED ORDER — SODIUM CHLORIDE 0.9 % IJ SOLN: 3.0000 mL | Freq: Two times a day (BID) | INTRAMUSCULAR | Status: DC

## 2010-12-14 MED ORDER — SODIUM CHLORIDE 0.9 % IJ SOLN: 9.0000 mL | INTRAMUSCULAR | Status: DC | PRN

## 2010-12-14 MED ORDER — PROCHLORPERAZINE EDISYLATE 5 MG/ML IJ SOLN: 5.0000 mg | Freq: Four times a day (QID) | INTRAMUSCULAR | Status: DC | PRN

## 2010-12-14 MED ORDER — DICLOFENAC SODIUM 75 MG PO TBEC
150.0000 mg | DELAYED_RELEASE_TABLET | Freq: Two times a day (BID) | ORAL | Status: DC
  Administered 2010-12-16: 150 mg via ORAL
  Filled 2010-12-14 (×5): qty 2

## 2010-12-14 MED ORDER — DIPHENHYDRAMINE HCL 50 MG/ML IJ SOLN: 12.5000 mg | Freq: Four times a day (QID) | INTRAMUSCULAR | Status: DC | PRN

## 2010-12-14 MED ORDER — EZETIMIBE-SIMVASTATIN 10-40 MG PO TABS
1.0000 | ORAL_TABLET | Freq: Every day | ORAL | Status: DC
  Filled 2010-12-14 (×2): qty 1

## 2010-12-14 MED ORDER — VANCOMYCIN HCL 1000 MG IV SOLR
1250.0000 mg | Freq: Two times a day (BID) | INTRAVENOUS | Status: DC
  Filled 2010-12-14 (×3): qty 1250

## 2010-12-14 MED ORDER — MORPHINE SULFATE (PF) 1 MG/ML IV SOLN: INTRAVENOUS | Status: DC

## 2010-12-15 DIAGNOSIS — L02219 Cutaneous abscess of trunk, unspecified: Secondary | ICD-10-CM

## 2010-12-15 DIAGNOSIS — L03319 Cellulitis of trunk, unspecified: Secondary | ICD-10-CM

## 2010-12-15 DIAGNOSIS — Z1611 Resistance to penicillins: Secondary | ICD-10-CM

## 2010-12-15 LAB — VANCOMYCIN, TROUGH: Vancomycin Tr: 11.5 ug/mL (ref 10.0–20.0)

## 2010-12-15 LAB — CREATININE, SERUM
GFR calc Af Amer: >90 mL/min (ref >90)
GFR calc non Af Amer: >90 mL/min (ref >90)

## 2010-12-15 LAB — GLUCOSE, CAPILLARY

## 2010-12-15 MED ORDER — VANCOMYCIN HCL 1000 MG IV SOLR
1500.0000 mg | Freq: Two times a day (BID) | INTRAVENOUS | Status: DC
  Administered 2010-12-15 – 2010-12-16 (×2): 1500 mg via INTRAVENOUS
  Filled 2010-12-15 (×5): qty 1500

## 2010-12-16 DIAGNOSIS — L02213 Cutaneous abscess of chest wall: Secondary | ICD-10-CM

## 2010-12-16 DIAGNOSIS — L0231 Cutaneous abscess of buttock: Secondary | ICD-10-CM

## 2010-12-16 DIAGNOSIS — I10 Essential (primary) hypertension: Secondary | ICD-10-CM

## 2010-12-16 DIAGNOSIS — E119 Type 2 diabetes mellitus without complications: Secondary | ICD-10-CM

## 2010-12-16 MED ORDER — OXYCODONE-ACETAMINOPHEN 5-325 MG PO TABS: 1.0000 | ORAL_TABLET | Freq: Four times a day (QID) | ORAL | Status: AC | PRN

## 2010-12-16 NOTE — Progress Notes (Signed)
  Subjective: Sore at right chest wall abscess site.  Objective: Vital signs in last 24 hours: Temp:  [97.7 F (36.5 C)-97.9 F (36.6 C)] 97.7 F (36.5 C) (11/04 0606) Pulse Rate:  [51-52] 51  (11/04 0606) Resp:  [20] 20  (11/04 0606) BP: (146-153)/(83-86) 146/83 mmHg (11/04 0606) SpO2:  [99 %] 99 % (11/04 0606)    Intake/Output from previous day: 11/03 0701 - 11/04 0700 In: 753 [P.O.:240; IV Piggyback:513] Out: 1550 [Urine:1550] Intake/Output this shift:   PE:  Chest:  Right sided wound is clean with minimal surrounding erythema  Right buttock:  Indurated area with expressible blood but no pus.   Lab Results:   [facility] 12/14/10 0423  WBC 7.2  HGB 12.7*  HCT 37.4*  PLT 165   BMET  Basename 12/14/10 2326  NA --  K --  CL --  CO2 --  GLUCOSE --  BUN --  CREATININE 0.70  CALCIUM --   PT/INR No results found for this basename: LABPROT:2,INR:2 in the last 72 hours ABG No results found for this basename: PHART:2,PCO2:2,PO2:2,HCO3:2 in the last 72 hours  Studies/Results: @RISRSLT24 @  Anti-infectives: Anti-infectives     Start     Dose/Rate Route Frequency Ordered Stop   12/15/10 1200   vancomycin (VANCOCIN) 1,250 mg in sodium chloride 0.9 % 250 mL IVPB  Status:  Discontinued        1,250 mg 166.7 mL/hr over 90 Minutes Intravenous Every 12 hours 12/14/10 1656 12/15/10 0105          Assessment/Plan: 1.  s/p drainage of right chest wall abscess- improving overall; on IV Vanc.  2.  Right buttock abscess- slowly improving.  3.  DM-2-  cbgs 93-178.   On Metformin  4.  HTN- adequate control on meds.  Plan:  Discharge on oral Doxycycline and daily dressing changes.  Will need to arrange for [facility] daily visits for this.  Return to office in 1-2 weeks.   LOS: 4 days    Unknown Flannigan J 12/16/2010

## 2010-12-17 NOTE — Discharge Summary (Signed)
Physician Discharge Summary  Patient ID: JUEL BELLEROSE MRN: [id number] DOB/AGE: December 26, 1940 70 y.o.  Admit date: 12/12/2010 Discharge date: 12/16/2010  Admission Diagnoses:   Right chest wall and buttock abscesses. Discharge Diagnoses:  Active Problems:  Abscess of chest wall,right  Abscess of buttock, right  HTN (hypertension)  Diabetes mellitus   Discharged Condition: good  Hospital Course:  He was admitted to the hospital, started on IV vancomycin, and underwent incision and drainage of a right chest wall abscess. He also had a smaller right buttock abscess. Wet-to-dry dressing changes were started on his wounds. He responded to the dressing changes and IV antibiotics well. His blood sugars were adequately controlled. He is to be ready for discharge and oral antibiotics 12/16/2010  Consults: none    Treatments: antibiotics:   Discharge Exam: Blood pressure 163/93, pulse 50, temperature 97.9 F (36.6 C), temperature source Oral, resp. rate 20, height 5\' 9"  (1.753 m), weight 268 lb 15.7 oz (122.01 kg), SpO2 99.00%.  Right chest wall wound is clean with minimal surrounding cellulitis. Right buttock abscess is resolving.  Disposition: Home or Self Care  Discharge Orders    Future Orders Please Complete By Expires   Ambulatory referral to Home Health      Comments:   Please evaluate JERYN CERNEY for admission to [facility].  Disciplines requested: HHRN for saline wet to dry dressing change to right chest wall wound daily and dry dressing to right buttock wound daily.    Services to provide: Nursing  Physician to follow patient's care: Dr. Luretha Murphy , CCS.  Requested Start of Care Date: 12/17/2010  Special Instructions:  See wound care above.   Diet Carb Modified      Discharge instructions      Comments:   Follow up in 1-2 weeks in office.  Call [phone] to make appt.   Activity as tolerated - No restrictions      May shower / Bathe      Change  dressing (specify)      Comments:   Dressing change: One time per day using saline wet to dry on chest wall and a dry dressing on buttock.  Home health nurses will assist you with this.   Call MD for:  redness, tenderness, or signs of infection (pain, swelling, redness, odor or green/yellow discharge around incision site)      Call MD for:      Scheduling Instructions:   For appointment [phone]).  You need to be seen by Dr. Luretha Murphy in 1-2 weeks.     Discharge Medication List as of 12/16/2010 12:46 PM    START taking these medications   Details  oxyCODONE-acetaminophen (PERCOCET) 5-325 MG per tablet Take 1-2 tablets by mouth every 6 (six) hours as needed., Starting 12/16/2010, Until Wed 12/26/10, Print      CONTINUE these medications which have NOT CHANGED   Details  aspirin 81 MG tablet Take 81 mg by mouth daily.  , Until Discontinued, Historical Med    doxycycline (DORYX) 100 MG EC tablet Take 100 mg by mouth.  , Until Discontinued, Historical Med    ezetimibe-simvastatin (VYTORIN) 10-40 MG per tablet Take 1 tablet by mouth at bedtime.  , Until Discontinued, Historical Med    fish oil-omega-3 fatty acids 1000 MG capsule Take 1 g by mouth daily.  , Until Discontinued, Historical Med    metFORMIN (GLUCOPHAGE) 500 MG tablet Take 500 mg by mouth 2 (two) times daily with a meal.  ,  Until Discontinued, Historical Med    multivitamin (THERAGRAN) per tablet Take 1 tablet by mouth daily.  , Until Discontinued, Historical Med    nebivolol (BYSTOLIC) 10 MG tablet Take 10 mg by mouth daily.  , Until Discontinued, Historical Med    quinapril-hydrochlorothiazide (ACCURETIC) 20-12.5 MG per tablet Take 1 tablet by mouth daily.  , Until Discontinued, Historical Med       Follow-up Information    Follow up with [facility] . [facility] for daily dressing changes)    Contact information:   [phone]         Signed: Adolph Pollack 12/17/2010, 10:24 AM  DISCHARGE  INSTRUCTIONS TO PATIENT  Medications and dosages: current medication regimen unchanged.  REMINDER:   Carry a list of your medications and allergies with you at all times  Call your pharmacy at least 1 week in advance to refill prescriptions  Do not mix any prescribed pain medicine with alcohol  Do not drive any motor vehicles while taking pain medication.  Take medications with food.  Do not retake a pain medication if you vomit after taking it.  Activity: none  Return to work:  na  Hygiene:  You may shower daily  Wound care:   Dressing changes by home health nurses.  Follow-up appointments (date to return to physician): With Dr. Daphine Deutscher in 1-2 weeks.  Call Surgeon if you have:  Temperature greater than 101.5  Persistent nausea and vomiting  Severe uncontrolled pain  Redness, tenderness, or signs of infection (pain, swelling, redness, odor or green/yellow discharge around the site  Any other questions or concerns you may have after discharge  In an emergency, call 911 or go to an Emergency Department at a nearby hospital  Diet:   Begin with liquids, and if they are tolerated, resume your usual diet.  Avoid spicy, greasy or heavy foods.  If you have nausea or vomiting, go back to liquids.  If you cannot keep liquids down, call your doctor.  Avoid alcohol consumption while on prescription pain medications. Good nutrition promotes healing. Increase fiber and fluids.    SPECIAL INSTRUCTIONS:   I understand and acknowledge receipt of the above instructions.                                                                                                                                        Patient or Guardian Signature                                                                    Date/Time  Physician's or R.N.'s Signature                                                                   Date/Time  The discharge instructions have been reviewed with the patient and/or Family Member/Parent/Guardian.  Patient and/or Family Member/Parent/Guardian signed and retained a printed copy.

## 2010-12-18 LAB — WOUND CULTURE: Gram Stain: NONE SEEN

## 2010-12-27 ENCOUNTER — Ambulatory Visit (INDEPENDENT_AMBULATORY_CARE_PROVIDER_SITE_OTHER): Payer: Medicare Other | Admitting: Surgery

## 2010-12-27 ENCOUNTER — Encounter (INDEPENDENT_AMBULATORY_CARE_PROVIDER_SITE_OTHER): Payer: Self-pay | Admitting: Surgery

## 2010-12-27 VITALS — BP 152/100 | HR 68 | Temp 97.4°F | Resp 16 | Ht 70.0 in | Wt 261.4 lb

## 2010-12-27 DIAGNOSIS — L02223 Furuncle of chest wall: Secondary | ICD-10-CM

## 2010-12-27 NOTE — Progress Notes (Signed)
Jerry Figueroa 70 y.o.  Body mass index is 37.51 kg/(m^2).  Patient Active Problem List  Diagnoses  . Abscess of chest wall,right  . Abscess of buttock, right  . HTN (hypertension)  . Diabetes mellitus    No Known Allergies  Past Surgical History  Procedure Date  . Colonoscopy   . Appendectomy   . Rotator cuff repair   . Abscess removal 12/12/10    right axillary - Nelida Meuse, MD No diagnosis found.  The right chest wall cellulitis has healed very well. His drain site is healing. He can probably go with just a Band-Aid for now. I would advise him to keep himself clean with showers and followup with Dr. Clelia Croft as needed  Return p.r.n. Matt B. Daphine Deutscher, MD, [facility] [phone] beeper [phone]  12/27/2010 9:44 AM

## 2010-12-27 NOTE — Patient Instructions (Signed)
Complete antibiotics.  Shower with Dial soap  followup with Dr. Clelia Croft as needed.

## 2011-01-01 ENCOUNTER — Encounter (INDEPENDENT_AMBULATORY_CARE_PROVIDER_SITE_OTHER): Payer: Medicare Other | Admitting: Surgery

## 2011-02-15 DIAGNOSIS — D485 Neoplasm of uncertain behavior of skin: Secondary | ICD-10-CM | POA: Diagnosis not present

## 2011-02-15 DIAGNOSIS — C44320 Squamous cell carcinoma of skin of unspecified parts of face: Secondary | ICD-10-CM | POA: Diagnosis not present

## 2011-02-15 DIAGNOSIS — L570 Actinic keratosis: Secondary | ICD-10-CM | POA: Diagnosis not present

## 2011-02-15 DIAGNOSIS — L219 Seborrheic dermatitis, unspecified: Secondary | ICD-10-CM | POA: Diagnosis not present

## 2011-02-15 DIAGNOSIS — D0460 Carcinoma in situ of skin of unspecified upper limb, including shoulder: Secondary | ICD-10-CM | POA: Diagnosis not present

## 2011-02-15 DIAGNOSIS — D044 Carcinoma in situ of skin of scalp and neck: Secondary | ICD-10-CM | POA: Diagnosis not present

## 2011-02-25 DIAGNOSIS — M109 Gout, unspecified: Secondary | ICD-10-CM | POA: Diagnosis not present

## 2011-03-19 DIAGNOSIS — S0100XA Unspecified open wound of scalp, initial encounter: Secondary | ICD-10-CM | POA: Diagnosis not present

## 2011-03-22 DIAGNOSIS — D044 Carcinoma in situ of skin of scalp and neck: Secondary | ICD-10-CM | POA: Diagnosis not present

## 2011-03-22 DIAGNOSIS — L570 Actinic keratosis: Secondary | ICD-10-CM | POA: Diagnosis not present

## 2011-03-22 DIAGNOSIS — D0460 Carcinoma in situ of skin of unspecified upper limb, including shoulder: Secondary | ICD-10-CM | POA: Diagnosis not present

## 2011-03-26 DIAGNOSIS — C44320 Squamous cell carcinoma of skin of unspecified parts of face: Secondary | ICD-10-CM | POA: Diagnosis not present

## 2011-05-20 DIAGNOSIS — E119 Type 2 diabetes mellitus without complications: Secondary | ICD-10-CM | POA: Diagnosis not present

## 2011-05-20 DIAGNOSIS — E669 Obesity, unspecified: Secondary | ICD-10-CM | POA: Diagnosis not present

## 2011-05-20 DIAGNOSIS — R9430 Abnormal result of cardiovascular function study, unspecified: Secondary | ICD-10-CM | POA: Diagnosis not present

## 2011-05-20 DIAGNOSIS — Z0389 Encounter for observation for other suspected diseases and conditions ruled out: Secondary | ICD-10-CM | POA: Diagnosis not present

## 2011-05-20 DIAGNOSIS — I10 Essential (primary) hypertension: Secondary | ICD-10-CM | POA: Diagnosis not present

## 2011-05-29 DIAGNOSIS — D0470 Carcinoma in situ of skin of unspecified lower limb, including hip: Secondary | ICD-10-CM | POA: Diagnosis not present

## 2011-05-29 DIAGNOSIS — L570 Actinic keratosis: Secondary | ICD-10-CM | POA: Diagnosis not present

## 2011-05-29 DIAGNOSIS — L821 Other seborrheic keratosis: Secondary | ICD-10-CM | POA: Diagnosis not present

## 2011-05-29 DIAGNOSIS — D485 Neoplasm of uncertain behavior of skin: Secondary | ICD-10-CM | POA: Diagnosis not present

## 2011-05-29 DIAGNOSIS — D239 Other benign neoplasm of skin, unspecified: Secondary | ICD-10-CM | POA: Diagnosis not present

## 2011-05-29 DIAGNOSIS — C44601 Unspecified malignant neoplasm of skin of unspecified upper limb, including shoulder: Secondary | ICD-10-CM | POA: Diagnosis not present

## 2011-05-29 DIAGNOSIS — Z85828 Personal history of other malignant neoplasm of skin: Secondary | ICD-10-CM | POA: Diagnosis not present

## 2011-06-21 DIAGNOSIS — H25049 Posterior subcapsular polar age-related cataract, unspecified eye: Secondary | ICD-10-CM | POA: Diagnosis not present

## 2011-06-25 DIAGNOSIS — D0470 Carcinoma in situ of skin of unspecified lower limb, including hip: Secondary | ICD-10-CM | POA: Diagnosis not present

## 2011-06-25 DIAGNOSIS — C44691 Other specified malignant neoplasm of skin of unspecified upper limb, including shoulder: Secondary | ICD-10-CM | POA: Diagnosis not present

## 2011-06-26 DIAGNOSIS — H2510 Age-related nuclear cataract, unspecified eye: Secondary | ICD-10-CM | POA: Diagnosis not present

## 2011-06-26 DIAGNOSIS — E119 Type 2 diabetes mellitus without complications: Secondary | ICD-10-CM | POA: Diagnosis not present

## 2011-07-01 DIAGNOSIS — Z1211 Encounter for screening for malignant neoplasm of colon: Secondary | ICD-10-CM | POA: Diagnosis not present

## 2011-07-01 DIAGNOSIS — Z125 Encounter for screening for malignant neoplasm of prostate: Secondary | ICD-10-CM | POA: Diagnosis not present

## 2011-07-01 DIAGNOSIS — E782 Mixed hyperlipidemia: Secondary | ICD-10-CM | POA: Diagnosis not present

## 2011-07-01 DIAGNOSIS — I10 Essential (primary) hypertension: Secondary | ICD-10-CM | POA: Diagnosis not present

## 2011-07-01 DIAGNOSIS — Z0000 Encounter for general adult medical examination without abnormal findings: Secondary | ICD-10-CM | POA: Diagnosis not present

## 2011-07-01 DIAGNOSIS — E119 Type 2 diabetes mellitus without complications: Secondary | ICD-10-CM | POA: Diagnosis not present

## 2011-07-11 DIAGNOSIS — Z1211 Encounter for screening for malignant neoplasm of colon: Secondary | ICD-10-CM | POA: Diagnosis not present

## 2011-07-15 DIAGNOSIS — H269 Unspecified cataract: Secondary | ICD-10-CM | POA: Diagnosis not present

## 2011-07-15 DIAGNOSIS — H52209 Unspecified astigmatism, unspecified eye: Secondary | ICD-10-CM | POA: Diagnosis not present

## 2011-07-15 DIAGNOSIS — H2510 Age-related nuclear cataract, unspecified eye: Secondary | ICD-10-CM | POA: Diagnosis not present

## 2011-10-21 DIAGNOSIS — Z23 Encounter for immunization: Secondary | ICD-10-CM | POA: Diagnosis not present

## 2011-12-18 DIAGNOSIS — E1139 Type 2 diabetes mellitus with other diabetic ophthalmic complication: Secondary | ICD-10-CM | POA: Diagnosis not present

## 2011-12-26 DIAGNOSIS — Z85828 Personal history of other malignant neoplasm of skin: Secondary | ICD-10-CM | POA: Diagnosis not present

## 2011-12-26 DIAGNOSIS — D239 Other benign neoplasm of skin, unspecified: Secondary | ICD-10-CM | POA: Diagnosis not present

## 2011-12-26 DIAGNOSIS — L570 Actinic keratosis: Secondary | ICD-10-CM | POA: Diagnosis not present

## 2011-12-26 DIAGNOSIS — D485 Neoplasm of uncertain behavior of skin: Secondary | ICD-10-CM | POA: Diagnosis not present

## 2012-01-01 DIAGNOSIS — E119 Type 2 diabetes mellitus without complications: Secondary | ICD-10-CM | POA: Diagnosis not present

## 2012-01-01 DIAGNOSIS — E782 Mixed hyperlipidemia: Secondary | ICD-10-CM | POA: Diagnosis not present

## 2012-01-01 DIAGNOSIS — I10 Essential (primary) hypertension: Secondary | ICD-10-CM | POA: Diagnosis not present

## 2012-03-11 DIAGNOSIS — D485 Neoplasm of uncertain behavior of skin: Secondary | ICD-10-CM | POA: Diagnosis not present

## 2012-03-11 DIAGNOSIS — L570 Actinic keratosis: Secondary | ICD-10-CM | POA: Diagnosis not present

## 2012-05-13 DIAGNOSIS — L570 Actinic keratosis: Secondary | ICD-10-CM | POA: Diagnosis not present

## 2012-05-13 DIAGNOSIS — D235 Other benign neoplasm of skin of trunk: Secondary | ICD-10-CM | POA: Diagnosis not present

## 2012-05-13 DIAGNOSIS — D485 Neoplasm of uncertain behavior of skin: Secondary | ICD-10-CM | POA: Diagnosis not present

## 2012-05-20 DIAGNOSIS — R9430 Abnormal result of cardiovascular function study, unspecified: Secondary | ICD-10-CM | POA: Diagnosis not present

## 2012-05-20 DIAGNOSIS — E119 Type 2 diabetes mellitus without complications: Secondary | ICD-10-CM | POA: Diagnosis not present

## 2012-05-20 DIAGNOSIS — Z0389 Encounter for observation for other suspected diseases and conditions ruled out: Secondary | ICD-10-CM | POA: Diagnosis not present

## 2012-05-20 DIAGNOSIS — E782 Mixed hyperlipidemia: Secondary | ICD-10-CM | POA: Diagnosis not present

## 2012-05-20 DIAGNOSIS — E669 Obesity, unspecified: Secondary | ICD-10-CM | POA: Diagnosis not present

## 2012-09-16 DIAGNOSIS — Z6841 Body Mass Index (BMI) 40.0 and over, adult: Secondary | ICD-10-CM | POA: Diagnosis not present

## 2012-09-16 DIAGNOSIS — G4733 Obstructive sleep apnea (adult) (pediatric): Secondary | ICD-10-CM | POA: Diagnosis not present

## 2012-09-16 DIAGNOSIS — I10 Essential (primary) hypertension: Secondary | ICD-10-CM | POA: Diagnosis not present

## 2012-09-16 DIAGNOSIS — Z1331 Encounter for screening for depression: Secondary | ICD-10-CM | POA: Diagnosis not present

## 2012-09-16 DIAGNOSIS — H9190 Unspecified hearing loss, unspecified ear: Secondary | ICD-10-CM | POA: Diagnosis not present

## 2012-09-16 DIAGNOSIS — E119 Type 2 diabetes mellitus without complications: Secondary | ICD-10-CM | POA: Diagnosis not present

## 2012-09-16 DIAGNOSIS — Z0000 Encounter for general adult medical examination without abnormal findings: Secondary | ICD-10-CM | POA: Diagnosis not present

## 2012-09-16 DIAGNOSIS — E782 Mixed hyperlipidemia: Secondary | ICD-10-CM | POA: Diagnosis not present

## 2012-09-17 DIAGNOSIS — H9110 Presbycusis, unspecified ear: Secondary | ICD-10-CM | POA: Diagnosis not present

## 2012-09-17 DIAGNOSIS — H905 Unspecified sensorineural hearing loss: Secondary | ICD-10-CM | POA: Diagnosis not present

## 2012-09-17 DIAGNOSIS — H833X9 Noise effects on inner ear, unspecified ear: Secondary | ICD-10-CM | POA: Diagnosis not present

## 2012-09-22 DIAGNOSIS — Z1211 Encounter for screening for malignant neoplasm of colon: Secondary | ICD-10-CM | POA: Diagnosis not present

## 2012-09-24 DIAGNOSIS — G4733 Obstructive sleep apnea (adult) (pediatric): Secondary | ICD-10-CM | POA: Diagnosis not present

## 2012-11-16 ENCOUNTER — Telehealth: Payer: Self-pay | Admitting: Cardiology

## 2012-11-16 NOTE — Telephone Encounter (Signed)
New Problem  Pt called//not sure why he has a sleep study f/up on 11/4 if he has not received any information on sleep study to adjust cpap machine. Request a call back to discuss.

## 2012-11-19 NOTE — Telephone Encounter (Signed)
Please find out if patient has gotten his CPAP device and is doing ok on it.  His appt Nov 4th is required by insurance to make sure he is tolerating and to confirm compliance

## 2012-11-19 NOTE — Telephone Encounter (Signed)
Spoke with [facility], they advised that they would need an order from the doctor, in order to send someone out to do a pressure change. It is my understanding that Dr. Mayford Knife is managing the patients care regarding sleep study and cpap. Please forward to Dr. Mayford Knife

## 2012-11-19 NOTE — Telephone Encounter (Signed)
To Dr Turner to advise 

## 2012-11-20 NOTE — Telephone Encounter (Signed)
To Kenyatta to advise pt.

## 2012-11-25 NOTE — Telephone Encounter (Signed)
Advised patient of OSA, and CPAP titration was successful, follow-up as scheduled in 12/2012. If patient has any issue with CPAP call for an appointment.

## 2012-12-08 DIAGNOSIS — C44201 Unspecified malignant neoplasm of skin of unspecified ear and external auricular canal: Secondary | ICD-10-CM | POA: Diagnosis not present

## 2012-12-08 DIAGNOSIS — L570 Actinic keratosis: Secondary | ICD-10-CM | POA: Diagnosis not present

## 2012-12-08 DIAGNOSIS — D0460 Carcinoma in situ of skin of unspecified upper limb, including shoulder: Secondary | ICD-10-CM | POA: Diagnosis not present

## 2012-12-08 DIAGNOSIS — D485 Neoplasm of uncertain behavior of skin: Secondary | ICD-10-CM | POA: Diagnosis not present

## 2012-12-08 DIAGNOSIS — Z85828 Personal history of other malignant neoplasm of skin: Secondary | ICD-10-CM | POA: Diagnosis not present

## 2012-12-08 DIAGNOSIS — D239 Other benign neoplasm of skin, unspecified: Secondary | ICD-10-CM | POA: Diagnosis not present

## 2012-12-08 DIAGNOSIS — L259 Unspecified contact dermatitis, unspecified cause: Secondary | ICD-10-CM | POA: Diagnosis not present

## 2012-12-09 ENCOUNTER — Telehealth: Payer: Self-pay | Admitting: Cardiology

## 2012-12-09 NOTE — Telephone Encounter (Signed)
Spoke with pt and he was very upset. We gave him results of his sleep study over two months ago and [facility] never faxed the results and new titration level to [facility] as they were supposed to. I called [facility] and they confirmed they do not have anything new on file for pt. I called and LVM with [facility] to call me back ASAP regarding pt . I let pt know all of the above and he verbalized understanding. To Dr. Mayford Knife as a Lorain Childes

## 2012-12-09 NOTE — Telephone Encounter (Signed)
Called [facility] again this evening and LVM for a returned call.

## 2012-12-09 NOTE — Telephone Encounter (Signed)
New problem  Per Brynda Rim Phy please call pt to verify his sleep study results. Pt stated no one has called and discuss any results with him and he came in the office upset about it. Please call pt per Leotis Shames at [facility].

## 2012-12-10 NOTE — Telephone Encounter (Signed)
LVM again with [facility]. Stressed the importance for a phone call back TODAY

## 2012-12-11 NOTE — Telephone Encounter (Signed)
Called the sleep lab again. No response. TO Dr. Mayford Knife to advise. Pt is very upset his study and interpretation has not been sent to [facility] for his new CPAP settings. Please advise.

## 2012-12-14 ENCOUNTER — Other Ambulatory Visit: Payer: Self-pay | Admitting: General Surgery

## 2012-12-14 NOTE — Telephone Encounter (Signed)
LVM again for [facility] to call us regarding Sleep study results. They have yet to return my call.

## 2012-12-15 ENCOUNTER — Encounter: Payer: Self-pay | Admitting: Cardiology

## 2012-12-16 NOTE — Telephone Encounter (Signed)
[facility] received the paper work and pt is set to have an appt at [facility] on 11/10 for cpap pressure change.

## 2012-12-17 DIAGNOSIS — H2510 Age-related nuclear cataract, unspecified eye: Secondary | ICD-10-CM | POA: Diagnosis not present

## 2012-12-23 ENCOUNTER — Encounter: Payer: Self-pay | Admitting: Cardiology

## 2012-12-25 ENCOUNTER — Ambulatory Visit (INDEPENDENT_AMBULATORY_CARE_PROVIDER_SITE_OTHER): Payer: Medicare Other | Admitting: Cardiology

## 2012-12-25 ENCOUNTER — Encounter: Payer: Self-pay | Admitting: Cardiology

## 2012-12-25 VITALS — BP 160/90 | HR 54 | Ht 70.0 in | Wt 282.0 lb

## 2012-12-25 DIAGNOSIS — G4733 Obstructive sleep apnea (adult) (pediatric): Secondary | ICD-10-CM | POA: Insufficient documentation

## 2012-12-25 DIAGNOSIS — I10 Essential (primary) hypertension: Secondary | ICD-10-CM

## 2012-12-25 NOTE — Progress Notes (Signed)
[address] [place], [place]  [address] Phone: [phone] Fax:  [phone]  Date:  12/25/2012   ID:  Jerry Figueroa, DOB 1940/11/16, MRN [id number]  PCP:  Lupita Raider, MD  Sleep Medicine:  Armanda Magic, MD  History of Present Illness: Jerry Figueroa is a 72 y.o. male with a history of HTN who recently underwent sleep study and was found to have severe sleep apnea with an AHI of 40/hr.  He underewent CPAP titration to 14cm H2O.and is now here for followup.  He tolerates his CPAP machine well.  He tolerates the full face mask and feels the pressure is adequate.  He has no daytime sleepiness and feels rested when he gets up in the am.  He plays golf for exercise.   Wt Readings from Last 3 Encounters:  12/25/12 282 lb (127.914 kg)  12/27/10 261 lb 6.4 oz (118.57 kg)  12/12/10 268 lb 15.7 oz (122.01 kg)     Past Medical History  Diagnosis Date  . Diabetes mellitus   . Hypercholesteremia   . Gout   . Sleep apnea   . Arthritis   . Axillary abscess     right side    Current Outpatient Prescriptions  Medication Sig Dispense Refill  . allopurinol (ZYLOPRIM) 100 MG tablet Take 100 mg by mouth daily.      Marland Kitchen aspirin 81 MG tablet Take 81 mg by mouth daily.        Marland Kitchen atenolol (TENORMIN) 100 MG tablet Take 100 mg by mouth daily.      . Azilsartan Medoxomil (EDARBI) 80 MG TABS Take by mouth.      . ezetimibe-simvastatin (VYTORIN) 10-40 MG per tablet Take 1 tablet by mouth at bedtime.        . fish oil-omega-3 fatty acids 1000 MG capsule Take 1 g by mouth daily.        . multivitamin (THERAGRAN) per tablet Take 1 tablet by mouth daily.        . sitaGLIPtin-metformin (JANUMET) 50-500 MG per tablet Take 1 tablet by mouth 2 (two) times daily with a meal.       No current facility-administered medications for this visit.    Allergies:   No Known Allergies  Social History:  The patient  reports that he has quit smoking. He has never used smokeless tobacco. He reports  that he does not drink alcohol or use illicit drugs.   Family History:  The patient's family history includes Heart disease in his father and mother.   ROS:  Please see the history of present illness.      All other systems reviewed and negative.   PHYSICAL EXAM: VS:  BP 160/90  Pulse 54  Ht 5\' 10"  (1.778 m)  Wt 282 lb (127.914 kg)  BMI 40.46 kg/m2 Well nourished, well developed, in no acute distress HEENT: normal Neck: no JVD Cardiac:  normal S1, S2; RRR; no murmur Lungs:  clear to auscultation bilaterally, no wheezing, rhonchi or rales Abd: soft, nontender, no hepatomegaly Ext: no edema Skin: warm and dry Neuro:  CNs 2-12 intact, no focal abnormalities noted       ASSESSMENT AND PLAN:  1. Severe OSA on CPAP at 14cm H2O  - I will get a download from his DME 2. HTN - mildly elevated and he says at home it runs 140/78mmHg  - continue Atenolol 3. Obesity - I have encouraged him to try to get into a diet regimen and start  an exercise program for weight loss.  This will help his DM, HTN and his OSA  Followup with me in 6 months  Signed, Armanda Magic, MD 12/25/2012 11:13 AM

## 2012-12-25 NOTE — Patient Instructions (Signed)
[facility] will be calling to get a download on your CPAP machine. They should be in contact with you within the next week or so.  Your physician recommends that you continue on your current medications as directed. Please refer to the Current Medication list given to you today.  Your physician wants you to follow-up in: 6 months with Dr Sherlyn Lick will receive a reminder letter in the mail two months in advance. If you don't receive a letter, please call our office to schedule the follow-up appointment.

## 2013-01-11 DIAGNOSIS — C44221 Squamous cell carcinoma of skin of unspecified ear and external auricular canal: Secondary | ICD-10-CM | POA: Insufficient documentation

## 2013-01-11 DIAGNOSIS — C4490 Unspecified malignant neoplasm of skin, unspecified: Secondary | ICD-10-CM

## 2013-01-11 HISTORY — DX: Unspecified malignant neoplasm of skin, unspecified: C44.90

## 2013-01-21 DIAGNOSIS — C44221 Squamous cell carcinoma of skin of unspecified ear and external auricular canal: Secondary | ICD-10-CM | POA: Diagnosis not present

## 2013-02-02 ENCOUNTER — Encounter: Payer: Self-pay | Admitting: Cardiology

## 2013-02-02 DIAGNOSIS — C44221 Squamous cell carcinoma of skin of unspecified ear and external auricular canal: Secondary | ICD-10-CM | POA: Diagnosis not present

## 2013-03-03 ENCOUNTER — Encounter: Payer: Self-pay | Admitting: Radiation Oncology

## 2013-03-04 ENCOUNTER — Ambulatory Visit
Admission: RE | Admit: 2013-03-04 | Discharge: 2013-03-04 | Disposition: A | Discharge status: Home / Self Care | Payer: Medicare Other | Source: Ambulatory Visit | Attending: Radiation Oncology | Admitting: Radiation Oncology

## 2013-03-04 ENCOUNTER — Encounter: Payer: Self-pay | Admitting: Radiation Oncology

## 2013-03-04 VITALS — BP 124/83 | HR 51 | Temp 98.2°F | Resp 20 | Wt 278.0 lb

## 2013-03-04 DIAGNOSIS — Z79899 Other long term (current) drug therapy: Secondary | ICD-10-CM | POA: Insufficient documentation

## 2013-03-04 DIAGNOSIS — I10 Essential (primary) hypertension: Secondary | ICD-10-CM | POA: Insufficient documentation

## 2013-03-04 DIAGNOSIS — C44221 Squamous cell carcinoma of skin of unspecified ear and external auricular canal: Secondary | ICD-10-CM | POA: Diagnosis not present

## 2013-03-04 DIAGNOSIS — L905 Scar conditions and fibrosis of skin: Secondary | ICD-10-CM | POA: Insufficient documentation

## 2013-03-04 DIAGNOSIS — C4490 Unspecified malignant neoplasm of skin, unspecified: Secondary | ICD-10-CM

## 2013-03-04 DIAGNOSIS — E119 Type 2 diabetes mellitus without complications: Secondary | ICD-10-CM | POA: Insufficient documentation

## 2013-03-04 DIAGNOSIS — M109 Gout, unspecified: Secondary | ICD-10-CM | POA: Insufficient documentation

## 2013-03-04 DIAGNOSIS — Z85828 Personal history of other malignant neoplasm of skin: Secondary | ICD-10-CM | POA: Diagnosis not present

## 2013-03-04 HISTORY — DX: Essential (primary) hypertension: I10

## 2013-03-04 HISTORY — DX: Cutaneous abscess, unspecified: L02.91

## 2013-03-04 HISTORY — DX: Unspecified malignant neoplasm of skin, unspecified: C44.90

## 2013-03-04 HISTORY — DX: Carrier or suspected carrier of methicillin resistant Staphylococcus aureus: Z22.322

## 2013-03-04 HISTORY — DX: Unspecified osteoarthritis, unspecified site: M19.90

## 2013-03-04 NOTE — Progress Notes (Signed)
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 no  Pacemaker/ICD? no  Possible current pregnancy? na  Is the patient on methotrexate? no  Current Complaints / other details:  married

## 2013-03-04 NOTE — Progress Notes (Signed)
[facility] NEW PATIENT EVALUATION  Name: Jerry Figueroa MRN: [id number]  Date:   03/04/2013           DOB: 06-09-1940  Status: outpatient   CC: Mayra Neer, MD  Janann August, MD    REFERRING PHYSICIAN: Janann August, MD   DIAGNOSIS: Squamous cell carcinoma the skin, helix of right ear   HISTORY OF PRESENT ILLNESS:  Jerry Figueroa is a 73 y.o. male who is seen today for the courtesy of Dr. Link Snuffer for consideration of postoperative radiation therapy in the management of his squamous cell carcinoma arising from the helix of the right ear. He presented to Dr. Jari Pigg with a suspicious lesion along his right ear helix. It had been present for approximately 6 months. A biopsy 12/08/2012 was diagnostic for squamous cell carcinoma. He was referred to Dr. Link Snuffer for Mohs surgery. His carcinoma measured 1.5 x 1.4 cm. He underwent a 2 stage procedure with a postoperative defect of 3.0 x 1.7 which was closed with a full helical wedge. After the stage there was noted to be residual perineural squamous cell carcinoma at the deep margin. The nerve diameter was 0.12 mm. He is referred today for consideration of postoperative radiation therapy. He is extremely pleased with his cosmesis.  PREVIOUS RADIATION THERAPY: No   PAST MEDICAL HISTORY:  has a past medical history of Hypercholesteremia; Gout; Sleep apnea; Arthritis; Axillary abscess; Morbid obesity; Hypertension; Diabetes mellitus; Osteoarthritis; Abscess; MRSA (methicillin resistant staph aureus) culture positive (12/2010); and Skin cancer (01/2013).     PAST SURGICAL HISTORY:  Past Surgical History  Procedure Laterality Date  . Colonoscopy    . Appendectomy    . Rotator cuff repair Right 2011  . Abscess removal  12/12/10    right axillary - mrsa  . Replacement total knee Left   . Tonsillectomy    . Eye surgery    . Knee arthroplasty Left 12/2004  . Back surgery      ruptured disk      FAMILY HISTORY: family history includes Cancer in his brother and maternal grandmother; Heart attack in his father; Heart disease in his father and mother. His father died of a heart attack at 42 and his mother died from her rheumatic heart disease at 2.   SOCIAL HISTORY:  reports that he quit smoking about 48 years ago. He has never used smokeless tobacco. He reports that he does not drink alcohol or use illicit drugs. Married, 2 children. Retired from [employer] and also from [employer]. He had significant sun exposure.   ALLERGIES: Review of patient's allergies indicates no known allergies.   MEDICATIONS:  Current Outpatient Prescriptions  Medication Sig Dispense Refill  . allopurinol (ZYLOPRIM) 100 MG tablet Take 100 mg by mouth daily.      Marland Kitchen aspirin 81 MG tablet Take 81 mg by mouth daily.        Marland Kitchen atenolol (TENORMIN) 100 MG tablet Take 100 mg by mouth daily.      Marland Kitchen atorvastatin (LIPITOR) 80 MG tablet Take 80 mg by mouth daily.      . Azilsartan Medoxomil (EDARBI) 80 MG TABS Take by mouth.      . Dapagliflozin Propanediol (FARXIGA) 5 MG TABS Take by mouth daily.      . fish oil-omega-3 fatty acids 1000 MG capsule Take 1 g by mouth daily.        . multivitamin (THERAGRAN) per tablet Take 1 tablet by mouth daily.        Marland Kitchen  sitaGLIPtin-metformin (JANUMET) 50-500 MG per tablet Take 1 tablet by mouth 2 (two) times daily with a meal.       No current facility-administered medications for this encounter.     REVIEW OF SYSTEMS:  Pertinent items are noted in HPI.    PHYSICAL EXAM:  weight is 278 lb (126.1 kg). His temperature is 98.2 F (36.8 C). His blood pressure is 124/83 and his pulse is 51. His respiration is 20.   Alert and oriented 73 year old white male appearing his stated age. On inspection of the right ear there is a horizontal scar along the mid to lower ear which is well-healed. The thickness of the right ear measures approximately 2 cm, anteriorly to posteriorly. There is no  palpable periaricular, facial, periparotid, submandibular, or cervical lymphadenopathy. Cranial nerves 2-12 intact.   LABORATORY DATA:  Lab Results  Component Value Date   WBC 7.2 12/14/2010   HGB 12.7* 12/14/2010   HCT 37.4* 12/14/2010   MCV 88.4 12/14/2010   PLT 165 12/14/2010   Lab Results  Component Value Date   NA 137 12/12/2010   K 3.8 12/12/2010   CL 100 12/12/2010   CO2 25 12/12/2010   Lab Results  Component Value Date   ALT 11 12/12/2010   AST 10 12/12/2010   ALKPHOS 50 12/12/2010   BILITOT 0.6 12/12/2010      IMPRESSION: Squamous cell carcinoma skin arising from the right ear helix. He is a candidate for postoperative radiation therapy. Based on the NCCN guidelines he should receive 5000 cGy in 20 sessions. I discussed the potential acute and late toxicities of radiation therapy which should be reasonably well tolerated. Consent is signed today   PLAN: Jerry Figueroa will return for simulation/treatment planning early next week.   I spent 30 minutes minutes face to face with the patient and more than 50% of that time was spent in counseling and/or coordination of care.

## 2013-03-04 NOTE — Progress Notes (Signed)
Please see the Nurse Progress Note in the MD Initial Consult Encounter for this patient. 

## 2013-03-08 ENCOUNTER — Ambulatory Visit
Admission: RE | Admit: 2013-03-08 | Discharge: 2013-03-08 | Disposition: A | Discharge status: Home / Self Care | Payer: Medicare Other | Source: Ambulatory Visit | Attending: Radiation Oncology | Admitting: Radiation Oncology

## 2013-03-08 DIAGNOSIS — H9210 Otorrhea, unspecified ear: Secondary | ICD-10-CM | POA: Insufficient documentation

## 2013-03-08 DIAGNOSIS — Z510 Encounter for antineoplastic radiation therapy: Secondary | ICD-10-CM | POA: Insufficient documentation

## 2013-03-08 DIAGNOSIS — L988 Other specified disorders of the skin and subcutaneous tissue: Secondary | ICD-10-CM | POA: Diagnosis not present

## 2013-03-08 DIAGNOSIS — Y842 Radiological procedure and radiotherapy as the cause of abnormal reaction of the patient, or of later complication, without mention of misadventure at the time of the procedure: Secondary | ICD-10-CM | POA: Insufficient documentation

## 2013-03-08 DIAGNOSIS — C44221 Squamous cell carcinoma of skin of unspecified ear and external auricular canal: Secondary | ICD-10-CM | POA: Diagnosis not present

## 2013-03-08 NOTE — Progress Notes (Signed)
Complex simulation/treatment planning note: The patient was placed on the CT simulation table for construction of a head cast. I outlined the treatment field along his right ear. A custom head cast was then constructed. He was then taken to the [facility] where the field size was measured for construction of a custom block. I'm prescribing 5000 cGy in 20 sessions utilizing 9 MEV electrons. That'll be placed posterior to right ear to minimize exit into the aspirate region. We will also construction your 0.8 cm Vaseline gauze for bolus on the first day of his treatment. A special port plan is requested.

## 2013-03-09 ENCOUNTER — Encounter: Payer: Self-pay | Admitting: Radiation Oncology

## 2013-03-09 DIAGNOSIS — C44221 Squamous cell carcinoma of skin of unspecified ear and external auricular canal: Secondary | ICD-10-CM | POA: Diagnosis not present

## 2013-03-09 NOTE — Progress Notes (Signed)
Electron beam simulation/treatment planning note: The patient completed his treatment planning in preparation of treatment to his right ear. I am prescribing 5000 cGy in 20 sessions utilizing 9 MEV electrons. After review of the special treatment plan I prescribing his dose to 90% isodose curve. He'll have construction of bolus on the first day of his treatment to maximize the dose to the skin surface. One custom block was constructed to conform the field.

## 2013-03-10 DIAGNOSIS — D0460 Carcinoma in situ of skin of unspecified upper limb, including shoulder: Secondary | ICD-10-CM | POA: Diagnosis not present

## 2013-03-15 ENCOUNTER — Ambulatory Visit
Admission: RE | Admit: 2013-03-15 | Discharge: 2013-03-15 | Disposition: A | Discharge status: Home / Self Care | Payer: Medicare Other | Source: Ambulatory Visit | Attending: Radiation Oncology | Admitting: Radiation Oncology

## 2013-03-15 ENCOUNTER — Encounter: Payer: Self-pay | Admitting: Radiation Oncology

## 2013-03-15 VITALS — BP 133/71 | HR 52 | Temp 98.2°F | Resp 20 | Wt 277.6 lb

## 2013-03-15 DIAGNOSIS — Z510 Encounter for antineoplastic radiation therapy: Secondary | ICD-10-CM | POA: Diagnosis not present

## 2013-03-15 DIAGNOSIS — L988 Other specified disorders of the skin and subcutaneous tissue: Secondary | ICD-10-CM | POA: Diagnosis not present

## 2013-03-15 DIAGNOSIS — C44221 Squamous cell carcinoma of skin of unspecified ear and external auricular canal: Secondary | ICD-10-CM | POA: Diagnosis not present

## 2013-03-15 DIAGNOSIS — H9210 Otorrhea, unspecified ear: Secondary | ICD-10-CM | POA: Diagnosis not present

## 2013-03-15 MED ORDER — RADIAPLEXRX EX GEL
Freq: Once | CUTANEOUS | Status: AC
  Administered 2013-03-15: 11:00:00 via TOPICAL

## 2013-03-15 NOTE — Progress Notes (Signed)
Post sim ed completed w/pt. Gave pt "Radiation and You" booklet w/all pertinent information marked and discussed, re: skin irritation/management, nutrition, pain. Gave pt Radiaplex lotion w/instructions for proper use. All questions answered. Pt verbalized understanding. Pt denies pain, fatigue, loss of appetite.

## 2013-03-15 NOTE — Progress Notes (Signed)
Weekly Management Note:  Site: Right ear Current Dose:  250  cGy Projected Dose: 5000  cGy  Narrative: The patient is seen today for routine under treatment assessment. CBCT/MVCT images/port films were reviewed. The chart was reviewed.   No complaints today. Setup is excellent.  Physical Examination:  Filed Vitals:   03/15/13 1043  BP: 133/71  Pulse: 52  Temp: 98.2 F (36.8 C)  Resp: 20  .  Weight: 277 lb 9.6 oz (125.919 kg). No skin changes.  Impression: Tolerating radiation therapy well.  Plan: Continue radiation therapy as planned.

## 2013-03-16 ENCOUNTER — Ambulatory Visit
Admission: RE | Admit: 2013-03-16 | Discharge: 2013-03-16 | Disposition: A | Discharge status: Home / Self Care | Payer: Medicare Other | Source: Ambulatory Visit | Attending: Radiation Oncology | Admitting: Radiation Oncology

## 2013-03-16 DIAGNOSIS — C44221 Squamous cell carcinoma of skin of unspecified ear and external auricular canal: Secondary | ICD-10-CM | POA: Diagnosis not present

## 2013-03-16 DIAGNOSIS — L988 Other specified disorders of the skin and subcutaneous tissue: Secondary | ICD-10-CM | POA: Diagnosis not present

## 2013-03-16 DIAGNOSIS — Z510 Encounter for antineoplastic radiation therapy: Secondary | ICD-10-CM | POA: Diagnosis not present

## 2013-03-16 DIAGNOSIS — H9210 Otorrhea, unspecified ear: Secondary | ICD-10-CM | POA: Diagnosis not present

## 2013-03-17 ENCOUNTER — Ambulatory Visit
Admission: RE | Admit: 2013-03-17 | Discharge: 2013-03-17 | Disposition: A | Discharge status: Home / Self Care | Payer: Medicare Other | Source: Ambulatory Visit | Attending: Radiation Oncology | Admitting: Radiation Oncology

## 2013-03-17 DIAGNOSIS — Z510 Encounter for antineoplastic radiation therapy: Secondary | ICD-10-CM | POA: Diagnosis not present

## 2013-03-17 DIAGNOSIS — C44221 Squamous cell carcinoma of skin of unspecified ear and external auricular canal: Secondary | ICD-10-CM | POA: Diagnosis not present

## 2013-03-17 DIAGNOSIS — H9210 Otorrhea, unspecified ear: Secondary | ICD-10-CM | POA: Diagnosis not present

## 2013-03-17 DIAGNOSIS — L988 Other specified disorders of the skin and subcutaneous tissue: Secondary | ICD-10-CM | POA: Diagnosis not present

## 2013-03-18 ENCOUNTER — Ambulatory Visit
Admission: RE | Admit: 2013-03-18 | Discharge: 2013-03-18 | Disposition: A | Discharge status: Home / Self Care | Payer: Medicare Other | Source: Ambulatory Visit | Attending: Radiation Oncology | Admitting: Radiation Oncology

## 2013-03-18 DIAGNOSIS — L988 Other specified disorders of the skin and subcutaneous tissue: Secondary | ICD-10-CM | POA: Diagnosis not present

## 2013-03-18 DIAGNOSIS — Z510 Encounter for antineoplastic radiation therapy: Secondary | ICD-10-CM | POA: Diagnosis not present

## 2013-03-18 DIAGNOSIS — C44221 Squamous cell carcinoma of skin of unspecified ear and external auricular canal: Secondary | ICD-10-CM | POA: Diagnosis not present

## 2013-03-18 DIAGNOSIS — H9210 Otorrhea, unspecified ear: Secondary | ICD-10-CM | POA: Diagnosis not present

## 2013-03-19 ENCOUNTER — Ambulatory Visit
Admission: RE | Admit: 2013-03-19 | Discharge: 2013-03-19 | Disposition: A | Discharge status: Home / Self Care | Payer: Medicare Other | Source: Ambulatory Visit | Attending: Radiation Oncology | Admitting: Radiation Oncology

## 2013-03-19 ENCOUNTER — Encounter: Payer: Self-pay | Admitting: Interventional Cardiology

## 2013-03-19 DIAGNOSIS — Z6841 Body Mass Index (BMI) 40.0 and over, adult: Secondary | ICD-10-CM | POA: Diagnosis not present

## 2013-03-19 DIAGNOSIS — IMO0001 Reserved for inherently not codable concepts without codable children: Secondary | ICD-10-CM | POA: Diagnosis not present

## 2013-03-19 DIAGNOSIS — M1990 Unspecified osteoarthritis, unspecified site: Secondary | ICD-10-CM | POA: Diagnosis not present

## 2013-03-19 DIAGNOSIS — I10 Essential (primary) hypertension: Secondary | ICD-10-CM | POA: Diagnosis not present

## 2013-03-19 DIAGNOSIS — Z23 Encounter for immunization: Secondary | ICD-10-CM | POA: Diagnosis not present

## 2013-03-19 DIAGNOSIS — Z510 Encounter for antineoplastic radiation therapy: Secondary | ICD-10-CM | POA: Diagnosis not present

## 2013-03-19 DIAGNOSIS — C44221 Squamous cell carcinoma of skin of unspecified ear and external auricular canal: Secondary | ICD-10-CM | POA: Diagnosis not present

## 2013-03-19 DIAGNOSIS — E782 Mixed hyperlipidemia: Secondary | ICD-10-CM | POA: Diagnosis not present

## 2013-03-19 DIAGNOSIS — H9210 Otorrhea, unspecified ear: Secondary | ICD-10-CM | POA: Diagnosis not present

## 2013-03-19 DIAGNOSIS — L988 Other specified disorders of the skin and subcutaneous tissue: Secondary | ICD-10-CM | POA: Diagnosis not present

## 2013-03-22 ENCOUNTER — Ambulatory Visit
Admission: RE | Admit: 2013-03-22 | Discharge: 2013-03-22 | Disposition: A | Discharge status: Home / Self Care | Payer: Medicare Other | Source: Ambulatory Visit | Attending: Radiation Oncology | Admitting: Radiation Oncology

## 2013-03-22 VITALS — BP 119/65 | HR 66 | Temp 97.8°F | Ht 70.0 in | Wt 279.2 lb

## 2013-03-22 DIAGNOSIS — Z510 Encounter for antineoplastic radiation therapy: Secondary | ICD-10-CM | POA: Diagnosis not present

## 2013-03-22 DIAGNOSIS — H9210 Otorrhea, unspecified ear: Secondary | ICD-10-CM | POA: Diagnosis not present

## 2013-03-22 DIAGNOSIS — C44221 Squamous cell carcinoma of skin of unspecified ear and external auricular canal: Secondary | ICD-10-CM

## 2013-03-22 DIAGNOSIS — L988 Other specified disorders of the skin and subcutaneous tissue: Secondary | ICD-10-CM | POA: Diagnosis not present

## 2013-03-22 NOTE — Progress Notes (Signed)
Weekly Management Note:  Site: Right ear Current Dose:  1500  cGy Projected Dose: 5000  cGy  Narrative: The patient is seen today for routine under treatment assessment. CBCT/MVCT images/port films were reviewed. The chart was reviewed.   No complaints today. He uses Radioplex gel.  Physical Examination:  Filed Vitals:   03/22/13 1833  BP: 119/65  Pulse: 66  Temp: 97.8 F (36.6 C)  .  Weight: 279 lb 3.2 oz (126.644 kg). He is mild erythema the skin along his right ear helix/treatment field.  Impression: Tolerating radiation therapy well.  Plan: Continue radiation therapy as planned.

## 2013-03-22 NOTE — Progress Notes (Signed)
Jerry Figueroa has had 6 fractions to his right ear.  He denies pain.  The skin on his right ear is red.  He is using radiaplex gel twice a day.

## 2013-03-23 ENCOUNTER — Ambulatory Visit
Admission: RE | Admit: 2013-03-23 | Discharge: 2013-03-23 | Disposition: A | Discharge status: Home / Self Care | Payer: Medicare Other | Source: Ambulatory Visit | Attending: Radiation Oncology | Admitting: Radiation Oncology

## 2013-03-23 DIAGNOSIS — C44221 Squamous cell carcinoma of skin of unspecified ear and external auricular canal: Secondary | ICD-10-CM | POA: Diagnosis not present

## 2013-03-23 DIAGNOSIS — L988 Other specified disorders of the skin and subcutaneous tissue: Secondary | ICD-10-CM | POA: Diagnosis not present

## 2013-03-23 DIAGNOSIS — Z510 Encounter for antineoplastic radiation therapy: Secondary | ICD-10-CM | POA: Diagnosis not present

## 2013-03-23 DIAGNOSIS — H9210 Otorrhea, unspecified ear: Secondary | ICD-10-CM | POA: Diagnosis not present

## 2013-03-24 ENCOUNTER — Ambulatory Visit
Admission: RE | Admit: 2013-03-24 | Discharge: 2013-03-24 | Disposition: A | Discharge status: Home / Self Care | Payer: Medicare Other | Source: Ambulatory Visit | Attending: Radiation Oncology | Admitting: Radiation Oncology

## 2013-03-24 DIAGNOSIS — L988 Other specified disorders of the skin and subcutaneous tissue: Secondary | ICD-10-CM | POA: Diagnosis not present

## 2013-03-24 DIAGNOSIS — C44221 Squamous cell carcinoma of skin of unspecified ear and external auricular canal: Secondary | ICD-10-CM | POA: Diagnosis not present

## 2013-03-24 DIAGNOSIS — Z510 Encounter for antineoplastic radiation therapy: Secondary | ICD-10-CM | POA: Diagnosis not present

## 2013-03-24 DIAGNOSIS — H9210 Otorrhea, unspecified ear: Secondary | ICD-10-CM | POA: Diagnosis not present

## 2013-03-25 ENCOUNTER — Ambulatory Visit
Admission: RE | Admit: 2013-03-25 | Discharge: 2013-03-25 | Disposition: A | Discharge status: Home / Self Care | Payer: Medicare Other | Source: Ambulatory Visit | Attending: Radiation Oncology | Admitting: Radiation Oncology

## 2013-03-25 DIAGNOSIS — L988 Other specified disorders of the skin and subcutaneous tissue: Secondary | ICD-10-CM | POA: Diagnosis not present

## 2013-03-25 DIAGNOSIS — C44221 Squamous cell carcinoma of skin of unspecified ear and external auricular canal: Secondary | ICD-10-CM | POA: Diagnosis not present

## 2013-03-25 DIAGNOSIS — Z510 Encounter for antineoplastic radiation therapy: Secondary | ICD-10-CM | POA: Diagnosis not present

## 2013-03-25 DIAGNOSIS — H9210 Otorrhea, unspecified ear: Secondary | ICD-10-CM | POA: Diagnosis not present

## 2013-03-26 ENCOUNTER — Ambulatory Visit
Admission: RE | Admit: 2013-03-26 | Discharge: 2013-03-26 | Disposition: A | Discharge status: Home / Self Care | Payer: Medicare Other | Source: Ambulatory Visit | Attending: Radiation Oncology | Admitting: Radiation Oncology

## 2013-03-26 DIAGNOSIS — C44221 Squamous cell carcinoma of skin of unspecified ear and external auricular canal: Secondary | ICD-10-CM | POA: Diagnosis not present

## 2013-03-26 DIAGNOSIS — H9210 Otorrhea, unspecified ear: Secondary | ICD-10-CM | POA: Diagnosis not present

## 2013-03-26 DIAGNOSIS — Z510 Encounter for antineoplastic radiation therapy: Secondary | ICD-10-CM | POA: Diagnosis not present

## 2013-03-26 DIAGNOSIS — L988 Other specified disorders of the skin and subcutaneous tissue: Secondary | ICD-10-CM | POA: Diagnosis not present

## 2013-03-29 ENCOUNTER — Encounter: Payer: Self-pay | Admitting: Radiation Oncology

## 2013-03-29 ENCOUNTER — Ambulatory Visit
Admission: RE | Admit: 2013-03-29 | Discharge: 2013-03-29 | Disposition: A | Discharge status: Home / Self Care | Payer: Medicare Other | Source: Ambulatory Visit | Attending: Radiation Oncology | Admitting: Radiation Oncology

## 2013-03-29 VITALS — BP 134/89 | HR 60 | Temp 97.7°F | Ht 70.0 in | Wt 276.4 lb

## 2013-03-29 DIAGNOSIS — C44221 Squamous cell carcinoma of skin of unspecified ear and external auricular canal: Secondary | ICD-10-CM | POA: Diagnosis not present

## 2013-03-29 DIAGNOSIS — L988 Other specified disorders of the skin and subcutaneous tissue: Secondary | ICD-10-CM | POA: Diagnosis not present

## 2013-03-29 DIAGNOSIS — Z510 Encounter for antineoplastic radiation therapy: Secondary | ICD-10-CM | POA: Diagnosis not present

## 2013-03-29 DIAGNOSIS — H9210 Otorrhea, unspecified ear: Secondary | ICD-10-CM | POA: Diagnosis not present

## 2013-03-29 NOTE — Progress Notes (Signed)
Jerry Figueroa has not voiced concerns today.  Note mild swelling and bright erythema of his right ear.  He denies pain.

## 2013-03-29 NOTE — Progress Notes (Signed)
Weekly Management Note:  Site: Right ear Current Dose:  2750  cGy Projected Dose: 5000 cGy  Narrative: The patient is seen today for routine under treatment assessment. CBCT/MVCT images/port films were reviewed. The chart was reviewed.   He is without complaints today. He uses Radioplex gel.  Physical Examination:  Filed Vitals:   03/29/13 1657  BP: 134/89  Pulse: 60  Temp: 97.7 F (36.5 C)  .  Weight: 276 lb 6.4 oz (125.374 kg). There is erythema of the year as expected. No areas of desquamation.  Impression: Tolerating radiation therapy well.  Plan: Continue radiation therapy as planned.

## 2013-03-30 ENCOUNTER — Ambulatory Visit
Admission: RE | Admit: 2013-03-30 | Discharge: 2013-03-30 | Disposition: A | Discharge status: Home / Self Care | Payer: Medicare Other | Source: Ambulatory Visit | Attending: Radiation Oncology | Admitting: Radiation Oncology

## 2013-03-30 DIAGNOSIS — C44221 Squamous cell carcinoma of skin of unspecified ear and external auricular canal: Secondary | ICD-10-CM | POA: Diagnosis not present

## 2013-03-30 DIAGNOSIS — H9210 Otorrhea, unspecified ear: Secondary | ICD-10-CM | POA: Diagnosis not present

## 2013-03-30 DIAGNOSIS — L988 Other specified disorders of the skin and subcutaneous tissue: Secondary | ICD-10-CM | POA: Diagnosis not present

## 2013-03-30 DIAGNOSIS — Z510 Encounter for antineoplastic radiation therapy: Secondary | ICD-10-CM | POA: Diagnosis not present

## 2013-03-31 ENCOUNTER — Ambulatory Visit
Admission: RE | Admit: 2013-03-31 | Discharge: 2013-03-31 | Disposition: A | Discharge status: Home / Self Care | Payer: Medicare Other | Source: Ambulatory Visit | Attending: Radiation Oncology | Admitting: Radiation Oncology

## 2013-03-31 DIAGNOSIS — H9210 Otorrhea, unspecified ear: Secondary | ICD-10-CM | POA: Diagnosis not present

## 2013-03-31 DIAGNOSIS — C44221 Squamous cell carcinoma of skin of unspecified ear and external auricular canal: Secondary | ICD-10-CM | POA: Diagnosis not present

## 2013-03-31 DIAGNOSIS — L988 Other specified disorders of the skin and subcutaneous tissue: Secondary | ICD-10-CM | POA: Diagnosis not present

## 2013-03-31 DIAGNOSIS — Z510 Encounter for antineoplastic radiation therapy: Secondary | ICD-10-CM | POA: Diagnosis not present

## 2013-04-01 ENCOUNTER — Ambulatory Visit: Payer: Medicare Other

## 2013-04-02 ENCOUNTER — Ambulatory Visit
Admission: RE | Admit: 2013-04-02 | Discharge: 2013-04-02 | Disposition: A | Discharge status: Home / Self Care | Payer: Medicare Other | Source: Ambulatory Visit | Attending: Radiation Oncology | Admitting: Radiation Oncology

## 2013-04-02 DIAGNOSIS — Z510 Encounter for antineoplastic radiation therapy: Secondary | ICD-10-CM | POA: Diagnosis not present

## 2013-04-02 DIAGNOSIS — L988 Other specified disorders of the skin and subcutaneous tissue: Secondary | ICD-10-CM | POA: Diagnosis not present

## 2013-04-02 DIAGNOSIS — C44221 Squamous cell carcinoma of skin of unspecified ear and external auricular canal: Secondary | ICD-10-CM | POA: Diagnosis not present

## 2013-04-02 DIAGNOSIS — H9210 Otorrhea, unspecified ear: Secondary | ICD-10-CM | POA: Diagnosis not present

## 2013-04-05 ENCOUNTER — Ambulatory Visit
Admission: RE | Admit: 2013-04-05 | Discharge: 2013-04-05 | Disposition: A | Discharge status: Home / Self Care | Payer: Medicare Other | Source: Ambulatory Visit | Attending: Radiation Oncology | Admitting: Radiation Oncology

## 2013-04-05 ENCOUNTER — Encounter: Payer: Self-pay | Admitting: Radiation Oncology

## 2013-04-05 VITALS — BP 119/82 | HR 60 | Temp 97.2°F | Ht 70.0 in | Wt 274.4 lb

## 2013-04-05 DIAGNOSIS — H9210 Otorrhea, unspecified ear: Secondary | ICD-10-CM | POA: Diagnosis not present

## 2013-04-05 DIAGNOSIS — L988 Other specified disorders of the skin and subcutaneous tissue: Secondary | ICD-10-CM | POA: Diagnosis not present

## 2013-04-05 DIAGNOSIS — C44221 Squamous cell carcinoma of skin of unspecified ear and external auricular canal: Secondary | ICD-10-CM

## 2013-04-05 DIAGNOSIS — Z510 Encounter for antineoplastic radiation therapy: Secondary | ICD-10-CM | POA: Diagnosis not present

## 2013-04-05 NOTE — Progress Notes (Signed)
Jerry Figueroa has received 15 fractions to his left ear.  The ear is swollen and red with dryness of the skin.  He denies any changes in his hearing and states tenderness in this area.

## 2013-04-05 NOTE — Progress Notes (Signed)
Weekly Management Note:  Site: Right ear Current Dose:  3750  cGy Projected Dose: 5000  cGy  Narrative: The patient is seen today for routine under treatment assessment. CBCT/MVCT images/port films were reviewed. The chart was reviewed.   He does report more right ear discomfort. He is using Radioplex gel.  Physical Examination:  Filed Vitals:   04/05/13 1653  BP: 119/82  Pulse: 60  Temp: 97.2 F (36.2 C)  .  Weight: 274 lb 6.4 oz (124.467 kg). There is moderate erythema along the right ear with impending desquamation.  Impression: Tolerating radiation therapy well. I will check his ear again this Thursday to mark his progress. If his skin reaction is severe and I may drop his last treatment.  Plan: Continue radiation therapy as planned.

## 2013-04-06 ENCOUNTER — Ambulatory Visit
Admission: RE | Admit: 2013-04-06 | Discharge: 2013-04-06 | Disposition: A | Discharge status: Home / Self Care | Payer: Medicare Other | Source: Ambulatory Visit | Attending: Radiation Oncology | Admitting: Radiation Oncology

## 2013-04-06 DIAGNOSIS — L988 Other specified disorders of the skin and subcutaneous tissue: Secondary | ICD-10-CM | POA: Diagnosis not present

## 2013-04-06 DIAGNOSIS — Z510 Encounter for antineoplastic radiation therapy: Secondary | ICD-10-CM | POA: Diagnosis not present

## 2013-04-06 DIAGNOSIS — H9210 Otorrhea, unspecified ear: Secondary | ICD-10-CM | POA: Diagnosis not present

## 2013-04-06 DIAGNOSIS — C44221 Squamous cell carcinoma of skin of unspecified ear and external auricular canal: Secondary | ICD-10-CM | POA: Diagnosis not present

## 2013-04-07 ENCOUNTER — Ambulatory Visit
Admission: RE | Admit: 2013-04-07 | Discharge: 2013-04-07 | Disposition: A | Discharge status: Home / Self Care | Payer: Medicare Other | Source: Ambulatory Visit | Attending: Radiation Oncology | Admitting: Radiation Oncology

## 2013-04-07 DIAGNOSIS — H9210 Otorrhea, unspecified ear: Secondary | ICD-10-CM | POA: Diagnosis not present

## 2013-04-07 DIAGNOSIS — Z510 Encounter for antineoplastic radiation therapy: Secondary | ICD-10-CM | POA: Diagnosis not present

## 2013-04-07 DIAGNOSIS — L988 Other specified disorders of the skin and subcutaneous tissue: Secondary | ICD-10-CM | POA: Diagnosis not present

## 2013-04-07 DIAGNOSIS — C44221 Squamous cell carcinoma of skin of unspecified ear and external auricular canal: Secondary | ICD-10-CM | POA: Diagnosis not present

## 2013-04-08 ENCOUNTER — Ambulatory Visit
Admission: RE | Admit: 2013-04-08 | Discharge: 2013-04-08 | Disposition: A | Discharge status: Home / Self Care | Payer: Medicare Other | Source: Ambulatory Visit | Attending: Radiation Oncology | Admitting: Radiation Oncology

## 2013-04-08 DIAGNOSIS — L988 Other specified disorders of the skin and subcutaneous tissue: Secondary | ICD-10-CM | POA: Diagnosis not present

## 2013-04-08 DIAGNOSIS — Z510 Encounter for antineoplastic radiation therapy: Secondary | ICD-10-CM | POA: Diagnosis not present

## 2013-04-08 DIAGNOSIS — H9210 Otorrhea, unspecified ear: Secondary | ICD-10-CM | POA: Diagnosis not present

## 2013-04-08 DIAGNOSIS — C44221 Squamous cell carcinoma of skin of unspecified ear and external auricular canal: Secondary | ICD-10-CM | POA: Diagnosis not present

## 2013-04-09 ENCOUNTER — Ambulatory Visit
Admission: RE | Admit: 2013-04-09 | Discharge: 2013-04-09 | Disposition: A | Discharge status: Home / Self Care | Payer: Medicare Other | Source: Ambulatory Visit | Attending: Radiation Oncology | Admitting: Radiation Oncology

## 2013-04-09 ENCOUNTER — Ambulatory Visit: Payer: Medicare Other

## 2013-04-09 DIAGNOSIS — L988 Other specified disorders of the skin and subcutaneous tissue: Secondary | ICD-10-CM | POA: Diagnosis not present

## 2013-04-09 DIAGNOSIS — C44221 Squamous cell carcinoma of skin of unspecified ear and external auricular canal: Secondary | ICD-10-CM | POA: Diagnosis not present

## 2013-04-09 DIAGNOSIS — H9210 Otorrhea, unspecified ear: Secondary | ICD-10-CM | POA: Diagnosis not present

## 2013-04-09 DIAGNOSIS — Z510 Encounter for antineoplastic radiation therapy: Secondary | ICD-10-CM | POA: Diagnosis not present

## 2013-04-12 ENCOUNTER — Encounter: Payer: Self-pay | Admitting: Radiation Oncology

## 2013-04-12 ENCOUNTER — Ambulatory Visit: Admission: RE | Admit: 2013-04-12 | Payer: Medicare Other | Source: Ambulatory Visit

## 2013-04-12 ENCOUNTER — Ambulatory Visit
Admission: RE | Admit: 2013-04-12 | Discharge: 2013-04-12 | Disposition: A | Discharge status: Home / Self Care | Payer: Medicare Other | Source: Ambulatory Visit | Attending: Radiation Oncology | Admitting: Radiation Oncology

## 2013-04-12 VITALS — BP 142/72 | HR 61 | Resp 16 | Wt 274.0 lb

## 2013-04-12 DIAGNOSIS — H60399 Other infective otitis externa, unspecified ear: Secondary | ICD-10-CM

## 2013-04-12 DIAGNOSIS — C44221 Squamous cell carcinoma of skin of unspecified ear and external auricular canal: Secondary | ICD-10-CM

## 2013-04-12 MED ORDER — NEOMYCIN-POLYMYXIN-HC 3.5-10000-1 OT SOLN: 4.0000 [drp] | Freq: Three times a day (TID) | OTIC | Status: AC

## 2013-04-12 NOTE — Progress Notes (Signed)
Moderate erythema along the right ear note with desquamation. Reports using radiaplex bid as directed. Patient has completed 19 of 20 intended treatments. Patient has decided to stop treatment today and not complete the last day. Patient has a one month follow up appointment card for 05/20/2013 with Dr. Valere Dross. Reports ringing in right ear began two days ago. Reports mild pain due to skin irritation of right ear that doesn't require medication. Denies nausea, vomiting, headache or dizziness.

## 2013-04-12 NOTE — Progress Notes (Signed)
[facility] End of Treatment Note  Name:[name]  Date: 04/12/2013 KGU:[id number] DOB:01/22/1941   Status:outpatient    CC: Mayra Neer, MD Dr. Janann August  REFERRING PHYSICIAN:    Dr. Janann August   DIAGNOSIS:  Squamous cell carcinoma of the skin, helix of the right ear  INDICATION FOR TREATMENT: Curative, postoperative   TREATMENT DATES: 03/15/2013 through 04/09/2013                          SITE/DOSE:  Right ear 4750 cGy 19 sessions                          BEAMS/ENERGY:  9 MEV electrons   with 0.8 cm bolus to maximize the dose to the skin surface              NARRATIVE:  [name] tolerated treatment well but developed moist desquamation along the anterior posterior right inferior ear helix on completion of therapy. I instructed him to use antibiotic ointment.   He has slight drainage from his right ear canal with a cloudy exudate. I placed him on Cortisporin eardrops as well.                        PLAN: Routine followup in one week. Patient instructed to call if questions or worsening complaints in interim.

## 2013-04-12 NOTE — Progress Notes (Signed)
Clinic note: The patient is seen today prior to his final scheduled treatment to his right ear. He is currently at 4750 cGy 19 sessions, with his dose prescribed at 90% isodose curve. Over the weekend he developed a moist desquamation along the anterior and posterior tumor bed. He also notes some drainage from his right ear canal. He has been using a Q-tip in his ear.  Physical examination: There is moist desquamation along his anterior posterior inferior external ear. On inspection of ear canal there is cloudy drainage with some crusting.  Impression: I feel that his delivered dose is certainly satisfactory, and I would be concerned about an ear infection.  Plan: I am discontinuing his treatment, and I will start Cortisporin eardrops, 4 drops 3 times a day for 7 days. Follow visit with me in one week. I will then see him for a followup visit in one month.

## 2013-04-14 ENCOUNTER — Encounter: Payer: Self-pay | Admitting: *Deleted

## 2013-04-20 ENCOUNTER — Encounter: Payer: Self-pay | Admitting: Radiation Oncology

## 2013-04-20 ENCOUNTER — Ambulatory Visit
Admission: RE | Admit: 2013-04-20 | Discharge: 2013-04-20 | Disposition: A | Discharge status: Home / Self Care | Payer: Medicare Other | Source: Ambulatory Visit | Attending: Radiation Oncology | Admitting: Radiation Oncology

## 2013-04-20 VITALS — BP 151/73 | HR 59 | Temp 97.2°F | Resp 20 | Wt 272.4 lb

## 2013-04-20 DIAGNOSIS — C44221 Squamous cell carcinoma of skin of unspecified ear and external auricular canal: Secondary | ICD-10-CM

## 2013-04-20 NOTE — Progress Notes (Signed)
Followup note:  Jerry Figueroa returns today just over one week from completion of radiation therapy in the management of his squamous cell carcinoma the skin involving the helix of the right ear. I placed him on Cortisporin eardrops for external ear drainage. He has been using Neosporin ointment, but he feels that "makes the crusts falloff and bleed". He is not having any further drainage from his ear canal.  Physical examination: Alert and oriented. Filed Vitals:   04/20/13 1132  BP: 151/73  Pulse: 59  Temp: 97.2 F (36.2 C)  Resp: 20   On inspection the right ear there is residual erythema as expected. There is crusted drainage along his right external ear inferiorly. The ear canal is clear except for slight drainage along the inferior aspect of his tympanic membrane.  Impression: Satisfactory progress. I told him that he could clean the crusts along his external ear with diluted hydrogen peroxide. I would like for him to continue with his Cortisporin eardrops for one more week.  Plan: Followup visit with me as scheduled on April 9.

## 2013-04-20 NOTE — Progress Notes (Signed)
Follow up s/p right ear rad txs 03/15/13-04/09/13, no c/o pain, letting ear stay dry, stopped using neosprorin, stated, "it would pull scabs off and start bleeding again", swelling down in ear, still has necrotic scanbs inside ear and behind ear, putting antibiotic drops in ear 3x day still 11:35 AM

## 2013-05-07 ENCOUNTER — Telehealth: Payer: Self-pay | Admitting: *Deleted

## 2013-05-07 NOTE — Telephone Encounter (Signed)
Returned vm from pt. He states a few days ago he began experiencing pain inside his right ear when he blows his nose. He denies ear drainage, sore throat, fever, HA or other problems. Advised pt that this may likely be due to the continued healing of his right ear after completing radiation treatment on 04/09/13. Also suggested that as he blows his nose the pressure inside his right ear is increased which may cause discomfort. Advised he try to add moisture inside his nose prior to blowing his nose to decrease the effort exerted. Enc pt to call this office if he begins to experience ear drainage, fever, sore throat, HA or other problems. Pt has follow up w/Dr Valere Dross on 05/20/13.  Informed pt that Dr Valere Dross is out of office until 05/17/13, but that there are other drs in this office that could see him sooner if necessary. Pt verbalized agreement and understanding.

## 2013-05-13 DIAGNOSIS — D0460 Carcinoma in situ of skin of unspecified upper limb, including shoulder: Secondary | ICD-10-CM | POA: Diagnosis not present

## 2013-05-13 DIAGNOSIS — L570 Actinic keratosis: Secondary | ICD-10-CM | POA: Diagnosis not present

## 2013-05-13 DIAGNOSIS — Z85828 Personal history of other malignant neoplasm of skin: Secondary | ICD-10-CM | POA: Diagnosis not present

## 2013-05-13 DIAGNOSIS — D239 Other benign neoplasm of skin, unspecified: Secondary | ICD-10-CM | POA: Diagnosis not present

## 2013-05-13 DIAGNOSIS — D485 Neoplasm of uncertain behavior of skin: Secondary | ICD-10-CM | POA: Diagnosis not present

## 2013-05-20 ENCOUNTER — Encounter: Payer: Self-pay | Admitting: Radiation Oncology

## 2013-05-20 ENCOUNTER — Ambulatory Visit
Admission: RE | Admit: 2013-05-20 | Discharge: 2013-05-20 | Disposition: A | Discharge status: Home / Self Care | Payer: Medicare Other | Source: Ambulatory Visit | Attending: Radiation Oncology | Admitting: Radiation Oncology

## 2013-05-20 VITALS — BP 140/74 | HR 75 | Temp 97.7°F | Resp 20 | Wt 272.4 lb

## 2013-05-20 DIAGNOSIS — C44221 Squamous cell carcinoma of skin of unspecified ear and external auricular canal: Secondary | ICD-10-CM

## 2013-05-20 NOTE — Progress Notes (Signed)
Follow up rad tx right ear 03/15/13-04/09/13, well healed , no c/o, sees Dr.Karen Delman Cheadle 06/16/13 did biopsy right arm last week, squamous cell stated, appetite good 1:26 PM

## 2013-05-20 NOTE — Progress Notes (Signed)
CC: Dr. Dr. Janann Figueroa, Dr. Jari Figueroa  [name] returns today approximately 5 weeks following completion of postoperative ration therapy in the management of his squamous cell carcinoma the skin involving the helix the right ear. He is without complaints today. He is seeing Dr. 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 He sees her again later this month.  Physical examination: Alert and oriented. On inspection the right ear there has been complete reepithelialization and there is no visible or palpable evidence for recurrent disease. There is no periaricular, submandibular, or cervical lymphadenopathy.  Impression: No evidence for recurrent disease.  Plan: Followup through Dr. Delman Figueroa.

## 2013-05-26 ENCOUNTER — Encounter: Payer: Self-pay | Admitting: Cardiology

## 2013-05-26 ENCOUNTER — Ambulatory Visit (INDEPENDENT_AMBULATORY_CARE_PROVIDER_SITE_OTHER): Payer: Medicare Other | Admitting: Cardiology

## 2013-05-26 VITALS — BP 149/96 | HR 73 | Ht 70.0 in | Wt 270.8 lb

## 2013-05-26 DIAGNOSIS — IMO0001 Reserved for inherently not codable concepts without codable children: Secondary | ICD-10-CM | POA: Diagnosis not present

## 2013-05-26 DIAGNOSIS — I10 Essential (primary) hypertension: Secondary | ICD-10-CM | POA: Diagnosis not present

## 2013-05-26 DIAGNOSIS — E1165 Type 2 diabetes mellitus with hyperglycemia: Secondary | ICD-10-CM | POA: Insufficient documentation

## 2013-05-26 DIAGNOSIS — R9439 Abnormal result of other cardiovascular function study: Secondary | ICD-10-CM | POA: Diagnosis not present

## 2013-05-26 DIAGNOSIS — IMO0002 Reserved for concepts with insufficient information to code with codable children: Secondary | ICD-10-CM

## 2013-05-26 NOTE — Patient Instructions (Signed)
Your physician recommends that you continue on your current medications as directed. Please refer to the Current Medication list given to you today.  Your physician wants you to follow-up in: 1 year. You will receive a reminder letter in the mail two months in advance. If you don't receive a letter, please call our office to schedule the follow-up appointment.  

## 2013-05-26 NOTE — Progress Notes (Signed)
[facility]. [address], [address] [place], [place]  [address] Phone: [phone] Fax:  [phone]  Date:  05/26/2013   ID:  Jerry Figueroa, DOB 04/08/1940, MRN [id number]  PCP:  Mayra Neer, MD   History of Present Illness: Jerry Figueroa is a 73 y.o. male with nuclear stress test on 2/11 which showed mild to moderate ischemia in the basal inferior/mid inferior/basal inferolateral and mid inferolateral regions EF was 57 percent. It was read as a low risk and Dr. Leonia Reeves did not feel that there was a strong indication for cardiac catheterization at that time. He is also being treated for diabetes, hypertension, hyperlipidemia, sleep apnea.   In regards to hyperlipidemia, he is on atorvastatin and his LDL is less than 70.   His blood pressure was elevated today. He states that usually it is been running very normal during recent checks when he was receiving radiation therapy to his ear. He is on angiotensin receptor blocker given his diabetes. Dr. Brigitte Pulse has been working on this and meds reviewed. He is compliant with his CPAP mask.   Both he and his wife need to lose weight and he understands this. We discussed this at length. He has rejoined [facility].    Wt Readings from Last 3 Encounters:  05/26/13 270 lb 12.8 oz (122.834 kg)  05/20/13 272 lb 6.4 oz (123.56 kg)  04/20/13 272 lb 6.4 oz (123.56 kg)     Past Medical History  Diagnosis Date  . Hypercholesteremia   . Gout   . Sleep apnea   . Arthritis   . Axillary abscess     right side  . Morbid obesity   . Hypertension   . Diabetes mellitus     diet controlled  . Osteoarthritis     left knee  . Abscess     axilla  . MRSA (methicillin resistant staph aureus) culture positive 12/2010  . Skin cancer 01/2013    squamous cell carcinoma right ear, nose  . History of radiation therapy 03/15/13- 04/09/13    right ear 4750 cGy 19 sessions    Past Surgical History  Procedure Laterality Date  . Colonoscopy    . Appendectomy      . Rotator cuff repair Right 2011  . Abscess removal  12/12/10    right axillary - mrsa  . Replacement total knee Left   . Tonsillectomy    . Eye surgery    . Knee arthroplasty Left 12/2004  . Back surgery      ruptured disk    Current Outpatient Prescriptions  Medication Sig Dispense Refill  . allopurinol (ZYLOPRIM) 100 MG tablet Take 100 mg by mouth daily.      Marland Kitchen AMLODIPINE BESYLATE PO Take 10 mg by mouth daily.      Marland Kitchen aspirin 81 MG tablet Take 81 mg by mouth daily.        Marland Kitchen atenolol (TENORMIN) 100 MG tablet Take 100 mg by mouth daily.      Marland Kitchen atorvastatin (LIPITOR) 80 MG tablet Take 80 mg by mouth daily.      . Azilsartan Medoxomil (EDARBI) 80 MG TABS Take by mouth.      . Dapagliflozin Propanediol (FARXIGA) 5 MG TABS Take by mouth daily.      . Exenatide ER (BYDUREON) 2 MG PEN Inject 2 mg into the skin once a week.      . fish oil-omega-3 fatty acids 1000 MG capsule Take 1 g by mouth daily.        Marland Kitchen  hyaluronate sodium (RADIAPLEXRX) GEL Apply 1 application topically 2 (two) times daily.      . multivitamin (THERAGRAN) per tablet Take 1 tablet by mouth daily.        . sitaGLIPtin-metformin (JANUMET) 50-500 MG per tablet Take 1 tablet by mouth 2 (two) times daily with a meal.       No current facility-administered medications for this visit.    Allergies:   No Known Allergies  Social History:  The patient  reports that he quit smoking about 48 years ago. He has never used smokeless tobacco. He reports that he does not drink alcohol or use illicit drugs.   ROS:  Please see the history of present illness.   Denies any fevers, chills, orthopnea, PND    PHYSICAL EXAM: VS:  BP 149/96  Pulse 73  Ht 5\' 10"  (1.778 m)  Wt 270 lb 12.8 oz (122.834 kg)  BMI 38.86 kg/m2 Well nourished, well developed, in no acute distress HEENT: normal Neck: no JVD Cardiac:  normal S1, S2; RRR; no murmur Lungs:  clear to auscultation bilaterally, no wheezing, rhonchi or rales Abd: soft, nontender,  no hepatomegalyOverweight Ext: no edema Skin: warm and dry Neuro: no focal abnormalities noted  EKG:  05/26/13-probable limb lead reversal, nonspecific ST-T wave changes. Sinus rhythm, 71.     Labs: 03/19/13-LDL 44, hemoglobin A1c 8.2.  ASSESSMENT AND PLAN:  1. Hyperlipidemia-excellent control of LDL cholesterol on statin therapy. We discussed the pluses and minuses of fish oil. He will continue with low-dose for now. 2. Morbid Obesity-continue to encourage weight loss. He struggled with this. Encourage exercise. Portion control. Decrease carbohydrates. Has comorbidities of diabetes as well as sleep apnea. 3. Hypertension, elevated today however he states normally it is been under good control. Continue with multidrug regimen. 4. Abnormal nuclear stress test-he's not having any anginal symptoms currently. Continue with aggressive primary prevention.  5. OSA - Dr. Radford Pax  Signed, Candee Furbish, MD [facility]  05/26/2013 9:29 AM

## 2013-06-04 DIAGNOSIS — T1510XA Foreign body in conjunctival sac, unspecified eye, initial encounter: Secondary | ICD-10-CM | POA: Diagnosis not present

## 2013-06-04 DIAGNOSIS — H1044 Vernal conjunctivitis: Secondary | ICD-10-CM | POA: Diagnosis not present

## 2013-06-17 DIAGNOSIS — D0460 Carcinoma in situ of skin of unspecified upper limb, including shoulder: Secondary | ICD-10-CM | POA: Diagnosis not present

## 2013-07-09 ENCOUNTER — Telehealth: Payer: Self-pay | Admitting: Cardiology

## 2013-07-09 NOTE — Telephone Encounter (Signed)
Spoke with Jerry Figueroa and she will refax forms over to be signed.  

## 2013-07-09 NOTE — Telephone Encounter (Signed)
Following up ° ° ° °Jerry Figueroa with [facility] called to find out the status of the billing paper work on this pt. May 8th they were fax to us.   Please give her a call with any question or fax [phone] °

## 2013-09-17 ENCOUNTER — Encounter: Payer: Self-pay | Admitting: Cardiology

## 2013-09-17 DIAGNOSIS — I10 Essential (primary) hypertension: Secondary | ICD-10-CM | POA: Diagnosis not present

## 2013-09-17 DIAGNOSIS — E782 Mixed hyperlipidemia: Secondary | ICD-10-CM | POA: Diagnosis not present

## 2013-09-17 DIAGNOSIS — IMO0001 Reserved for inherently not codable concepts without codable children: Secondary | ICD-10-CM | POA: Diagnosis not present

## 2013-09-17 DIAGNOSIS — Z6839 Body mass index (BMI) 39.0-39.9, adult: Secondary | ICD-10-CM | POA: Diagnosis not present

## 2013-11-10 DIAGNOSIS — G5600 Carpal tunnel syndrome, unspecified upper limb: Secondary | ICD-10-CM | POA: Diagnosis not present

## 2013-11-10 DIAGNOSIS — M19049 Primary osteoarthritis, unspecified hand: Secondary | ICD-10-CM | POA: Diagnosis not present

## 2013-12-02 DIAGNOSIS — L570 Actinic keratosis: Secondary | ICD-10-CM | POA: Diagnosis not present

## 2013-12-02 DIAGNOSIS — D2372 Other benign neoplasm of skin of left lower limb, including hip: Secondary | ICD-10-CM | POA: Diagnosis not present

## 2013-12-02 DIAGNOSIS — D485 Neoplasm of uncertain behavior of skin: Secondary | ICD-10-CM | POA: Diagnosis not present

## 2013-12-02 DIAGNOSIS — D239 Other benign neoplasm of skin, unspecified: Secondary | ICD-10-CM | POA: Diagnosis not present

## 2013-12-02 DIAGNOSIS — Z85828 Personal history of other malignant neoplasm of skin: Secondary | ICD-10-CM | POA: Diagnosis not present

## 2013-12-20 DIAGNOSIS — H2511 Age-related nuclear cataract, right eye: Secondary | ICD-10-CM | POA: Diagnosis not present

## 2013-12-20 DIAGNOSIS — E119 Type 2 diabetes mellitus without complications: Secondary | ICD-10-CM | POA: Diagnosis not present

## 2013-12-22 DIAGNOSIS — G5601 Carpal tunnel syndrome, right upper limb: Secondary | ICD-10-CM | POA: Diagnosis not present

## 2013-12-22 DIAGNOSIS — G5602 Carpal tunnel syndrome, left upper limb: Secondary | ICD-10-CM | POA: Diagnosis not present

## 2013-12-22 DIAGNOSIS — M1711 Unilateral primary osteoarthritis, right knee: Secondary | ICD-10-CM | POA: Diagnosis not present

## 2013-12-30 DIAGNOSIS — E782 Mixed hyperlipidemia: Secondary | ICD-10-CM | POA: Diagnosis not present

## 2013-12-30 DIAGNOSIS — I10 Essential (primary) hypertension: Secondary | ICD-10-CM | POA: Diagnosis not present

## 2013-12-30 DIAGNOSIS — Z6837 Body mass index (BMI) 37.0-37.9, adult: Secondary | ICD-10-CM | POA: Diagnosis not present

## 2013-12-30 DIAGNOSIS — E119 Type 2 diabetes mellitus without complications: Secondary | ICD-10-CM | POA: Diagnosis not present

## 2013-12-31 ENCOUNTER — Encounter: Payer: Self-pay | Admitting: Cardiology

## 2014-03-22 DIAGNOSIS — T148 Other injury of unspecified body region: Secondary | ICD-10-CM | POA: Diagnosis not present

## 2014-03-30 DIAGNOSIS — Z6838 Body mass index (BMI) 38.0-38.9, adult: Secondary | ICD-10-CM | POA: Diagnosis not present

## 2014-03-30 DIAGNOSIS — E119 Type 2 diabetes mellitus without complications: Secondary | ICD-10-CM | POA: Diagnosis not present

## 2014-03-30 DIAGNOSIS — Z0000 Encounter for general adult medical examination without abnormal findings: Secondary | ICD-10-CM | POA: Diagnosis not present

## 2014-03-30 DIAGNOSIS — M109 Gout, unspecified: Secondary | ICD-10-CM | POA: Diagnosis not present

## 2014-03-30 DIAGNOSIS — I10 Essential (primary) hypertension: Secondary | ICD-10-CM | POA: Diagnosis not present

## 2014-03-30 DIAGNOSIS — M159 Polyosteoarthritis, unspecified: Secondary | ICD-10-CM | POA: Diagnosis not present

## 2014-03-30 DIAGNOSIS — E782 Mixed hyperlipidemia: Secondary | ICD-10-CM | POA: Diagnosis not present

## 2014-04-01 ENCOUNTER — Encounter: Payer: Self-pay | Admitting: Internal Medicine

## 2014-05-16 DIAGNOSIS — M19071 Primary osteoarthritis, right ankle and foot: Secondary | ICD-10-CM | POA: Diagnosis not present

## 2014-05-24 ENCOUNTER — Ambulatory Visit (INDEPENDENT_AMBULATORY_CARE_PROVIDER_SITE_OTHER): Payer: Medicare Other | Admitting: Cardiology

## 2014-05-24 ENCOUNTER — Encounter: Payer: Self-pay | Admitting: Cardiology

## 2014-05-24 VITALS — BP 124/84 | HR 66 | Ht 70.0 in | Wt 257.0 lb

## 2014-05-24 DIAGNOSIS — I10 Essential (primary) hypertension: Secondary | ICD-10-CM

## 2014-05-24 DIAGNOSIS — R931 Abnormal findings on diagnostic imaging of heart and coronary circulation: Secondary | ICD-10-CM | POA: Diagnosis not present

## 2014-05-24 DIAGNOSIS — G4733 Obstructive sleep apnea (adult) (pediatric): Secondary | ICD-10-CM | POA: Diagnosis not present

## 2014-05-24 DIAGNOSIS — R9439 Abnormal result of other cardiovascular function study: Secondary | ICD-10-CM

## 2014-05-24 NOTE — Patient Instructions (Signed)
The current medical regimen is effective;  continue present plan and medications.  Follow up as needed.  Thank you for choosing [facility]!!

## 2014-05-24 NOTE — Progress Notes (Signed)
[address], [address] [place], [place]  [address] Phone: [phone] Fax:  [phone]  Date:  05/24/2014   ID:  Jerry Figueroa, DOB 10-08-1940, MRN [id number]  PCP:  Mayra Neer, MD   History of Present Illness: Jerry Figueroa is a 74 y.o. male with nuclear stress test on 2/11 which showed mild to moderate ischemia in the basal inferior/mid inferior/basal inferolateral and mid inferolateral regions EF was 57 percent. It was read as a low risk and Dr. Leonia Reeves did not feel that there was a strong indication for cardiac catheterization at that time. He is also being treated for diabetes, hypertension, hyperlipidemia, sleep apnea.   In regards to hyperlipidemia, he is on atorvastatin and his LDL is less than 70.   His blood pressure was elevated today. He states that usually it is been running very normal during recent checks when he was receiving radiation therapy to his ear. He is on angiotensin receptor blocker given his diabetes. Dr. Brigitte Pulse has been working on this and meds reviewed. He is compliant with his CPAP mask.   Both he and his wife need to lose weight and he understands this. We discussed this at length. He has rejoined [facility].   05/24/14- getting ready to move. He is going to [place]. Living with his son. He was in the [employer] for 27 years. Burtis Junes place to live was [place] and [place]. He is not having any significant symptoms other than arthritis pain especially in his feet. No chest pain, no shortness of breath.    Wt Readings from Last 3 Encounters:  05/24/14 257 lb (116.574 kg)  05/26/13 270 lb 12.8 oz (122.834 kg)  03/22/13 279 lb 3.2 oz (126.644 kg)     Past Medical History  Diagnosis Date  . Hypercholesteremia   . Gout   . Sleep apnea   . Arthritis   . Axillary abscess     right side  . Morbid obesity   . Hypertension   . Diabetes mellitus     diet controlled  . Osteoarthritis     left knee  . Abscess     axilla  . MRSA (methicillin  resistant staph aureus) culture positive 12/2010  . Skin cancer 01/2013    squamous cell carcinoma right ear, nose  . History of radiation therapy 03/15/13- 04/09/13    right ear 4750 cGy 19 sessions    Past Surgical History  Procedure Laterality Date  . Colonoscopy    . Appendectomy    . Rotator cuff repair Right 2011  . Abscess removal  12/12/10    right axillary - mrsa  . Replacement total knee Left   . Tonsillectomy    . Eye surgery    . Knee arthroplasty Left 12/2004  . Back surgery      ruptured disk    Current Outpatient Prescriptions  Medication Sig Dispense Refill  . allopurinol (ZYLOPRIM) 100 MG tablet Take 100 mg by mouth 2 (two) times daily.     Marland Kitchen AMLODIPINE BESYLATE PO Take 10 mg by mouth daily.    Marland Kitchen aspirin 81 MG tablet Take 81 mg by mouth daily.      Marland Kitchen atenolol (TENORMIN) 100 MG tablet Take 100 mg by mouth daily.    Marland Kitchen atorvastatin (LIPITOR) 80 MG tablet Take 80 mg by mouth daily.    . Azilsartan Medoxomil 80 MG TABS Take 80 mg by mouth daily.    . Exenatide ER (BYDUREON) 2  MG PEN Inject 2 mg into the skin once a week.    . fish oil-omega-3 fatty acids 1000 MG capsule Take 1 g by mouth daily.      . multivitamin (THERAGRAN) per tablet Take 1 tablet by mouth daily.      . SitaGLIPtin-MetFORMIN HCl [phone] MG TB24 Take by mouth daily.     No current facility-administered medications for this visit.    Allergies:   No Known Allergies  Social History:  The patient  reports that he quit smoking about 49 years ago. He has never used smokeless tobacco. He reports that he does not drink alcohol or use illicit drugs.   ROS:  Please see the history of present illness.   Denies any fevers, chills, orthopnea, PND    PHYSICAL EXAM: VS:  BP 124/84 mmHg  Pulse 66  Ht 5\' 10"  (1.778 m)  Wt 257 lb (116.574 kg)  BMI 36.88 kg/m2 Well nourished, well developed, in no acute distress HEENT: normal Neck: no JVD Cardiac:  normal S1, S2; RRR; no murmur Lungs:  clear to  auscultation bilaterally, no wheezing, rhonchi or rales Abd: soft, nontender, no hepatomegalyOverweight Ext: no edema Skin: warm and dry Neuro: no focal abnormalities noted  EKG:  05/24/14 -  Normal rhythm, 66, nonspecific ST-T wave changes, subtle inversion in V2, V3. From prior- 05/26/13-probable limb lead reversal, nonspecific ST-T wave changes. Sinus rhythm, 71.     Labs: 03/19/13-LDL 44, hemoglobin A1c 8.2.  ASSESSMENT AND PLAN:  1. Hyperlipidemia-excellent control of LDL cholesterol on statin therapy.  2. Morbid Obesity-continue to encourage weight loss.  Recently he has had weight loss. Perhaps that his diabetic medication. He is also increasing activity with recent move to [place]. He struggled with this. Encourage exercise. Portion control. Decrease carbohydrates. Has comorbidities of diabetes as well as sleep apnea. 3. Hypertension,been under good control. Continue with multidrug regimen. 4. Abnormal nuclear stress test-he's not having any anginal symptoms currently. Continue with aggressive primary prevention.  5. OSA - Dr. Radford Pax , doing well. 6. PRN follow up. Moving to  [place]. He is going to be living next door to his son.Apt next to the house.   Signed, Candee Furbish, MD [facility]  05/24/2014 4:13 PM

## 2014-05-26 ENCOUNTER — Ambulatory Visit (AMBULATORY_SURGERY_CENTER): Payer: Self-pay

## 2014-05-26 VITALS — Ht 70.0 in | Wt 262.0 lb

## 2014-05-26 DIAGNOSIS — Z1211 Encounter for screening for malignant neoplasm of colon: Secondary | ICD-10-CM

## 2014-05-26 NOTE — Progress Notes (Signed)
Per pt, no allergies to soy or egg products.Pt not taking any weight loss meds or using  O2 at home.Pt uses C-pap machine at home.

## 2014-05-27 ENCOUNTER — Ambulatory Visit: Admitting: Cardiology

## 2014-06-02 DIAGNOSIS — M19071 Primary osteoarthritis, right ankle and foot: Secondary | ICD-10-CM | POA: Diagnosis not present

## 2014-06-09 ENCOUNTER — Encounter: Payer: Self-pay | Admitting: Internal Medicine

## 2014-06-09 ENCOUNTER — Ambulatory Visit (AMBULATORY_SURGERY_CENTER): Payer: Medicare Other | Admitting: Internal Medicine

## 2014-06-09 VITALS — BP 126/81 | HR 63 | Temp 96.9°F | Resp 16 | Ht 70.0 in | Wt 262.0 lb

## 2014-06-09 DIAGNOSIS — D123 Benign neoplasm of transverse colon: Secondary | ICD-10-CM | POA: Diagnosis not present

## 2014-06-09 DIAGNOSIS — Z1211 Encounter for screening for malignant neoplasm of colon: Secondary | ICD-10-CM | POA: Diagnosis not present

## 2014-06-09 DIAGNOSIS — G4733 Obstructive sleep apnea (adult) (pediatric): Secondary | ICD-10-CM | POA: Diagnosis not present

## 2014-06-09 DIAGNOSIS — I10 Essential (primary) hypertension: Secondary | ICD-10-CM | POA: Diagnosis not present

## 2014-06-09 DIAGNOSIS — Z86010 Personal history of colonic polyps: Secondary | ICD-10-CM

## 2014-06-09 DIAGNOSIS — E119 Type 2 diabetes mellitus without complications: Secondary | ICD-10-CM | POA: Diagnosis not present

## 2014-06-09 HISTORY — DX: Personal history of colonic polyps: Z86.010

## 2014-06-09 LAB — GLUCOSE, CAPILLARY
GLUCOSE-CAPILLARY: 89 mg/dL (ref 70–99)
Glucose-Capillary: 85 mg/dL (ref 70–99)

## 2014-06-09 MED ORDER — SODIUM CHLORIDE 0.9 % IV SOLN: 500.0000 mL | INTRAVENOUS | Status: DC

## 2014-06-09 NOTE — Op Note (Signed)
[address]. [place], [address]   COLONOSCOPY PROCEDURE REPORT  PATIENT: Jerry Figueroa, Jerry Figueroa  MR#: [id number] BIRTHDATE: 31-Jul-1940 , 73  yrs. old GENDER: male ENDOSCOPIST: Gatha Mayer, MD, [facility] PROCEDURE DATE:  06/09/2014 PROCEDURE:   Colonoscopy, screening and Colonoscopy with snare polypectomy First Screening Colonoscopy - Avg.  risk and is 50 yrs.  old or older - No.  Prior Negative Screening - Now for repeat screening. 10 or more years since last screening  History of Adenoma - Now for follow-up colonoscopy & has been > or = to 3 yrs.  N/A ASA CLASS:   Class III INDICATIONS:Screening for colonic neoplasia and Colorectal Neoplasm Risk Assessment for this procedure is average risk. MEDICATIONS: Monitored anesthesia care and Propofol 200 mg IV  DESCRIPTION OF PROCEDURE:   After the risks benefits and alternatives of the procedure were thoroughly explained, informed consent was obtained.  The digital rectal exam revealed no abnormalities of the rectum, revealed the prostate was not enlarged, and revealed no prostatic nodules.   The LB CF-H180AL Loaner [id number]  endoscope was introduced through the anus and advanced to the cecum, which was identified by both the appendix and ileocecal valve. No adverse events experienced.   The quality of the prep was (MiraLax was used) adequate  The instrument was then slowly withdrawn as the colon was fully examined.      COLON FINDINGS: A sessile polyp measuring 3 mm in size was found in the transverse colon.  A polypectomy was performed with a cold snare.  The resection was complete, the polyp tissue was completely retrieved and sent to histology.   The examination was otherwise normal.  Retroflexed views revealed no abnormalities. The time to cecum = 2.2 Withdrawal time = 12.0   The scope was withdrawn and the procedure completed. COMPLICATIONS: There were no immediate complications.  ENDOSCOPIC  IMPRESSION: 1.   Sessile polyp was found in the transverse colon; polypectomy was performed with a cold snare 2.   The examination was otherwise normal  RECOMMENDATIONS: Possible repeat colonoscopy - no prior polyps, polyp was diminutive and he is 73 so may not need another routine colonoscopy. Await pathology review and will send letter.  eSigned:  Gatha Mayer, MD, [facility] 06/09/2014 11:32 AM   cc: The Patient  and Mayra Neer, MD

## 2014-06-09 NOTE — Progress Notes (Signed)
Called to room to assist during endoscopic procedure.  Patient ID and intended procedure confirmed with present staff. Received instructions for my participation in the procedure from the performing physician.  

## 2014-06-09 NOTE — Progress Notes (Signed)
Report to PACU, RN, vss, BBS= Clear.  

## 2014-06-09 NOTE — Patient Instructions (Addendum)
I found and removed one small polyp that looks benign. I will let you know pathology results and if/when to have another routine colonoscopy by mail.   I appreciate the opportunity to care for you. Gatha Mayer, MD, FACG  YOU HAD AN ENDOSCOPIC PROCEDURE TODAY AT [facility]:   Refer to the procedure report that was given to you for any specific questions about what was found during the examination.  If the procedure report does not answer your questions, please call your gastroenterologist to clarify.  If you requested that your care partner not be given the details of your procedure findings, then the procedure report has been included in a sealed envelope for you to review at your convenience later.  YOU SHOULD EXPECT: Some feelings of bloating in the abdomen. Passage of more gas than usual.  Walking can help get rid of the air that was put into your GI tract during the procedure and reduce the bloating. If you had a lower endoscopy (such as a colonoscopy or flexible sigmoidoscopy) you may notice spotting of blood in your stool or on the toilet paper. If you underwent a bowel prep for your procedure, you may not have a normal bowel movement for a few days.  Please Note:  You might notice some irritation and congestion in your nose or some drainage.  This is from the oxygen used during your procedure.  There is no need for concern and it should clear up in a day or so.  SYMPTOMS TO REPORT IMMEDIATELY:   Following lower endoscopy (colonoscopy or flexible sigmoidoscopy):  Excessive amounts of blood in the stool  Significant tenderness or worsening of abdominal pains  Swelling of the abdomen that is new, acute  Fever of 100F or higher   Following upper endoscopy (EGD)  Vomiting of blood or coffee ground material  New chest pain or pain under the shoulder blades  Painful or persistently difficult swallowing  New shortness of breath  Fever of 100F or  higher  Black, tarry-looking stools  For urgent or emergent issues, a gastroenterologist can be reached at any hour by calling [phone].   DIET: Your first meal following the procedure should be a small meal and then it is ok to progress to your normal diet. Heavy or fried foods are harder to digest and may make you feel nauseous or bloated.  Likewise, meals heavy in dairy and vegetables can increase bloating.  Drink plenty of fluids but you should avoid alcoholic beverages for 24 hours.  ACTIVITY:  You should plan to take it easy for the rest of today and you should NOT DRIVE or use heavy machinery until tomorrow (because of the sedation medicines used during the test).    FOLLOW UP: Our staff will call the number listed on your records the next business day following your procedure to check on you and address any questions or concerns that you may have regarding the information given to you following your procedure. If we do not reach you, we will leave a message.  However, if you are feeling well and you are not experiencing any problems, there is no need to return our call.  We will assume that you have returned to your regular daily activities without incident.  If any biopsies were taken you will be contacted by phone or by letter within the next 1-3 weeks.  Please call us at [phone] if you have not heard about the biopsies in  3 weeks.    SIGNATURES/CONFIDENTIALITY: You and/or your care partner have signed paperwork which will be entered into your electronic medical record.  These signatures attest to the fact that that the information above on your After Visit Summary has been reviewed and is understood.  Full responsibility of the confidentiality of this discharge information lies with you and/or your care-partner.  INFORMATION ON POLYPS GIVEN TO YOU TODAY

## 2014-06-10 ENCOUNTER — Telehealth: Payer: Self-pay | Admitting: *Deleted

## 2014-06-10 NOTE — Telephone Encounter (Signed)
  Follow up Call-  Call back number 06/09/2014  Post procedure Call Back phone  # [phone]  Permission to leave phone message Yes     Patient questions:  Do you have a fever, pain , or abdominal swelling? No. Pain Score  0 *  Have you tolerated food without any problems? Yes.    Have you been able to return to your normal activities? Yes.    Do you have any questions about your discharge instructions: Diet   No. Medications  no Follow up visit  no  Do you have questions or concerns about your Care?no  Actions: * If pain score is 4 or above: No action needed, pain <4.

## 2014-06-21 ENCOUNTER — Encounter: Payer: Self-pay | Admitting: Internal Medicine

## 2014-06-21 DIAGNOSIS — Z86010 Personal history of colonic polyps: Secondary | ICD-10-CM

## 2014-06-21 NOTE — Progress Notes (Signed)
Quick Note:  3 mm adenoma Could consider one moe repeat colonoscopy - reassess in 5 yrs 2021 ______

## 2014-09-30 DIAGNOSIS — E782 Mixed hyperlipidemia: Secondary | ICD-10-CM | POA: Diagnosis not present

## 2014-09-30 DIAGNOSIS — E119 Type 2 diabetes mellitus without complications: Secondary | ICD-10-CM | POA: Diagnosis not present

## 2014-09-30 DIAGNOSIS — I10 Essential (primary) hypertension: Secondary | ICD-10-CM | POA: Diagnosis not present

## 2014-09-30 DIAGNOSIS — Z6836 Body mass index (BMI) 36.0-36.9, adult: Secondary | ICD-10-CM | POA: Diagnosis not present

## 2015-03-02 DIAGNOSIS — E119 Type 2 diabetes mellitus without complications: Secondary | ICD-10-CM | POA: Diagnosis not present

## 2015-03-02 DIAGNOSIS — E789 Disorder of lipoprotein metabolism, unspecified: Secondary | ICD-10-CM | POA: Diagnosis not present

## 2015-03-02 DIAGNOSIS — I10 Essential (primary) hypertension: Secondary | ICD-10-CM | POA: Diagnosis not present

## 2015-03-02 DIAGNOSIS — M1000 Idiopathic gout, unspecified site: Secondary | ICD-10-CM | POA: Diagnosis not present

## 2015-03-02 DIAGNOSIS — C4491 Basal cell carcinoma of skin, unspecified: Secondary | ICD-10-CM | POA: Diagnosis not present

## 2015-03-14 DIAGNOSIS — C44319 Basal cell carcinoma of skin of other parts of face: Secondary | ICD-10-CM | POA: Diagnosis not present

## 2015-03-14 DIAGNOSIS — D485 Neoplasm of uncertain behavior of skin: Secondary | ICD-10-CM | POA: Diagnosis not present

## 2015-03-28 DIAGNOSIS — D485 Neoplasm of uncertain behavior of skin: Secondary | ICD-10-CM | POA: Diagnosis not present

## 2015-03-31 DIAGNOSIS — E119 Type 2 diabetes mellitus without complications: Secondary | ICD-10-CM | POA: Diagnosis not present

## 2015-03-31 DIAGNOSIS — Z961 Presence of intraocular lens: Secondary | ICD-10-CM | POA: Diagnosis not present

## 2015-03-31 DIAGNOSIS — H2511 Age-related nuclear cataract, right eye: Secondary | ICD-10-CM | POA: Diagnosis not present

## 2015-04-22 DIAGNOSIS — M549 Dorsalgia, unspecified: Secondary | ICD-10-CM | POA: Diagnosis not present

## 2015-04-22 DIAGNOSIS — G5603 Carpal tunnel syndrome, bilateral upper limbs: Secondary | ICD-10-CM | POA: Diagnosis not present

## 2015-05-02 DIAGNOSIS — D0472 Carcinoma in situ of skin of left lower limb, including hip: Secondary | ICD-10-CM | POA: Diagnosis not present

## 2015-05-02 DIAGNOSIS — D485 Neoplasm of uncertain behavior of skin: Secondary | ICD-10-CM | POA: Diagnosis not present

## 2015-06-12 DIAGNOSIS — M1990 Unspecified osteoarthritis, unspecified site: Secondary | ICD-10-CM | POA: Diagnosis not present

## 2015-06-12 DIAGNOSIS — M1612 Unilateral primary osteoarthritis, left hip: Secondary | ICD-10-CM | POA: Diagnosis not present

## 2015-06-12 DIAGNOSIS — M47816 Spondylosis without myelopathy or radiculopathy, lumbar region: Secondary | ICD-10-CM | POA: Diagnosis not present

## 2015-06-14 DIAGNOSIS — D485 Neoplasm of uncertain behavior of skin: Secondary | ICD-10-CM | POA: Diagnosis not present

## 2015-06-14 DIAGNOSIS — L578 Other skin changes due to chronic exposure to nonionizing radiation: Secondary | ICD-10-CM | POA: Diagnosis not present

## 2015-06-14 DIAGNOSIS — D225 Melanocytic nevi of trunk: Secondary | ICD-10-CM | POA: Diagnosis not present

## 2015-06-14 DIAGNOSIS — L570 Actinic keratosis: Secondary | ICD-10-CM | POA: Diagnosis not present

## 2015-06-29 DIAGNOSIS — D485 Neoplasm of uncertain behavior of skin: Secondary | ICD-10-CM | POA: Diagnosis not present

## 2015-06-29 DIAGNOSIS — L570 Actinic keratosis: Secondary | ICD-10-CM | POA: Diagnosis not present

## 2015-08-03 DIAGNOSIS — Z08 Encounter for follow-up examination after completed treatment for malignant neoplasm: Secondary | ICD-10-CM | POA: Diagnosis not present

## 2015-08-03 DIAGNOSIS — L578 Other skin changes due to chronic exposure to nonionizing radiation: Secondary | ICD-10-CM | POA: Diagnosis not present

## 2015-08-03 DIAGNOSIS — L570 Actinic keratosis: Secondary | ICD-10-CM | POA: Diagnosis not present

## 2015-08-03 DIAGNOSIS — Z85828 Personal history of other malignant neoplasm of skin: Secondary | ICD-10-CM | POA: Diagnosis not present

## 2015-08-16 DIAGNOSIS — G4733 Obstructive sleep apnea (adult) (pediatric): Secondary | ICD-10-CM | POA: Diagnosis not present

## 2015-08-24 DIAGNOSIS — D692 Other nonthrombocytopenic purpura: Secondary | ICD-10-CM | POA: Diagnosis not present

## 2015-08-24 DIAGNOSIS — L570 Actinic keratosis: Secondary | ICD-10-CM | POA: Diagnosis not present

## 2015-08-24 DIAGNOSIS — L218 Other seborrheic dermatitis: Secondary | ICD-10-CM | POA: Diagnosis not present

## 2015-09-07 DIAGNOSIS — Z9089 Acquired absence of other organs: Secondary | ICD-10-CM | POA: Diagnosis not present

## 2015-09-07 DIAGNOSIS — G4761 Periodic limb movement disorder: Secondary | ICD-10-CM | POA: Diagnosis not present

## 2015-09-07 DIAGNOSIS — Z9049 Acquired absence of other specified parts of digestive tract: Secondary | ICD-10-CM | POA: Diagnosis not present

## 2015-09-07 DIAGNOSIS — E119 Type 2 diabetes mellitus without complications: Secondary | ICD-10-CM | POA: Diagnosis not present

## 2015-09-07 DIAGNOSIS — E785 Hyperlipidemia, unspecified: Secondary | ICD-10-CM | POA: Diagnosis not present

## 2015-09-07 DIAGNOSIS — Z9889 Other specified postprocedural states: Secondary | ICD-10-CM | POA: Diagnosis not present

## 2015-09-07 DIAGNOSIS — I10 Essential (primary) hypertension: Secondary | ICD-10-CM | POA: Diagnosis not present

## 2015-09-07 DIAGNOSIS — Z7984 Long term (current) use of oral hypoglycemic drugs: Secondary | ICD-10-CM | POA: Diagnosis not present

## 2015-09-07 DIAGNOSIS — Z96652 Presence of left artificial knee joint: Secondary | ICD-10-CM | POA: Diagnosis not present

## 2015-09-07 DIAGNOSIS — G4733 Obstructive sleep apnea (adult) (pediatric): Secondary | ICD-10-CM | POA: Diagnosis not present

## 2015-09-07 DIAGNOSIS — Z79899 Other long term (current) drug therapy: Secondary | ICD-10-CM | POA: Diagnosis not present

## 2015-09-13 DIAGNOSIS — E119 Type 2 diabetes mellitus without complications: Secondary | ICD-10-CM | POA: Diagnosis not present

## 2015-09-13 DIAGNOSIS — E789 Disorder of lipoprotein metabolism, unspecified: Secondary | ICD-10-CM | POA: Diagnosis not present

## 2015-09-13 DIAGNOSIS — I10 Essential (primary) hypertension: Secondary | ICD-10-CM | POA: Diagnosis not present

## 2015-09-15 DIAGNOSIS — E119 Type 2 diabetes mellitus without complications: Secondary | ICD-10-CM | POA: Diagnosis not present

## 2015-09-15 DIAGNOSIS — I10 Essential (primary) hypertension: Secondary | ICD-10-CM | POA: Diagnosis not present

## 2015-09-15 DIAGNOSIS — M1000 Idiopathic gout, unspecified site: Secondary | ICD-10-CM | POA: Diagnosis not present

## 2015-09-15 DIAGNOSIS — E789 Disorder of lipoprotein metabolism, unspecified: Secondary | ICD-10-CM | POA: Diagnosis not present

## 2015-09-15 DIAGNOSIS — M1990 Unspecified osteoarthritis, unspecified site: Secondary | ICD-10-CM | POA: Diagnosis not present

## 2015-09-28 DIAGNOSIS — L578 Other skin changes due to chronic exposure to nonionizing radiation: Secondary | ICD-10-CM | POA: Diagnosis not present

## 2015-09-28 DIAGNOSIS — L821 Other seborrheic keratosis: Secondary | ICD-10-CM | POA: Diagnosis not present

## 2015-09-28 DIAGNOSIS — L218 Other seborrheic dermatitis: Secondary | ICD-10-CM | POA: Diagnosis not present

## 2015-09-28 DIAGNOSIS — D485 Neoplasm of uncertain behavior of skin: Secondary | ICD-10-CM | POA: Diagnosis not present

## 2015-09-28 DIAGNOSIS — L570 Actinic keratosis: Secondary | ICD-10-CM | POA: Diagnosis not present

## 2015-10-12 DIAGNOSIS — G4733 Obstructive sleep apnea (adult) (pediatric): Secondary | ICD-10-CM | POA: Diagnosis not present

## 2015-10-12 DIAGNOSIS — G4761 Periodic limb movement disorder: Secondary | ICD-10-CM | POA: Diagnosis not present

## 2015-10-19 DIAGNOSIS — D485 Neoplasm of uncertain behavior of skin: Secondary | ICD-10-CM | POA: Diagnosis not present

## 2015-10-19 DIAGNOSIS — L578 Other skin changes due to chronic exposure to nonionizing radiation: Secondary | ICD-10-CM | POA: Diagnosis not present

## 2015-11-06 DIAGNOSIS — G4733 Obstructive sleep apnea (adult) (pediatric): Secondary | ICD-10-CM | POA: Diagnosis not present

## 2015-11-09 DIAGNOSIS — Z23 Encounter for immunization: Secondary | ICD-10-CM | POA: Diagnosis not present

## 2015-11-09 DIAGNOSIS — R1032 Left lower quadrant pain: Secondary | ICD-10-CM | POA: Diagnosis not present

## 2015-11-14 DIAGNOSIS — Z961 Presence of intraocular lens: Secondary | ICD-10-CM | POA: Diagnosis not present

## 2015-11-14 DIAGNOSIS — H2511 Age-related nuclear cataract, right eye: Secondary | ICD-10-CM | POA: Diagnosis not present

## 2015-11-14 DIAGNOSIS — E119 Type 2 diabetes mellitus without complications: Secondary | ICD-10-CM | POA: Diagnosis not present

## 2015-11-22 DIAGNOSIS — R1032 Left lower quadrant pain: Secondary | ICD-10-CM | POA: Diagnosis not present

## 2015-11-23 DIAGNOSIS — D485 Neoplasm of uncertain behavior of skin: Secondary | ICD-10-CM | POA: Diagnosis not present

## 2015-11-23 DIAGNOSIS — D045 Carcinoma in situ of skin of trunk: Secondary | ICD-10-CM | POA: Diagnosis not present

## 2015-12-06 DIAGNOSIS — E789 Disorder of lipoprotein metabolism, unspecified: Secondary | ICD-10-CM | POA: Diagnosis not present

## 2015-12-06 DIAGNOSIS — I10 Essential (primary) hypertension: Secondary | ICD-10-CM | POA: Diagnosis not present

## 2015-12-06 DIAGNOSIS — M1000 Idiopathic gout, unspecified site: Secondary | ICD-10-CM | POA: Diagnosis not present

## 2015-12-06 DIAGNOSIS — E119 Type 2 diabetes mellitus without complications: Secondary | ICD-10-CM | POA: Diagnosis not present

## 2015-12-07 DIAGNOSIS — C44729 Squamous cell carcinoma of skin of left lower limb, including hip: Secondary | ICD-10-CM | POA: Diagnosis not present

## 2015-12-07 DIAGNOSIS — D485 Neoplasm of uncertain behavior of skin: Secondary | ICD-10-CM | POA: Diagnosis not present

## 2015-12-09 DIAGNOSIS — Z0000 Encounter for general adult medical examination without abnormal findings: Secondary | ICD-10-CM | POA: Diagnosis not present

## 2015-12-09 DIAGNOSIS — I10 Essential (primary) hypertension: Secondary | ICD-10-CM | POA: Diagnosis not present

## 2015-12-09 DIAGNOSIS — M1000 Idiopathic gout, unspecified site: Secondary | ICD-10-CM | POA: Diagnosis not present

## 2015-12-09 DIAGNOSIS — E119 Type 2 diabetes mellitus without complications: Secondary | ICD-10-CM | POA: Diagnosis not present

## 2015-12-09 DIAGNOSIS — E789 Disorder of lipoprotein metabolism, unspecified: Secondary | ICD-10-CM | POA: Diagnosis not present

## 2015-12-11 DIAGNOSIS — G4733 Obstructive sleep apnea (adult) (pediatric): Secondary | ICD-10-CM | POA: Diagnosis not present

## 2016-01-12 DIAGNOSIS — Z96652 Presence of left artificial knee joint: Secondary | ICD-10-CM | POA: Diagnosis not present

## 2016-01-12 DIAGNOSIS — M170 Bilateral primary osteoarthritis of knee: Secondary | ICD-10-CM | POA: Diagnosis not present

## 2016-02-20 DIAGNOSIS — G8929 Other chronic pain: Secondary | ICD-10-CM | POA: Diagnosis not present

## 2016-03-04 DIAGNOSIS — M170 Bilateral primary osteoarthritis of knee: Secondary | ICD-10-CM | POA: Diagnosis not present

## 2016-03-25 DIAGNOSIS — E119 Type 2 diabetes mellitus without complications: Secondary | ICD-10-CM | POA: Diagnosis not present

## 2016-03-25 DIAGNOSIS — H50111 Monocular exotropia, right eye: Secondary | ICD-10-CM | POA: Diagnosis not present

## 2016-03-25 DIAGNOSIS — H2511 Age-related nuclear cataract, right eye: Secondary | ICD-10-CM | POA: Diagnosis not present

## 2016-03-28 DIAGNOSIS — D485 Neoplasm of uncertain behavior of skin: Secondary | ICD-10-CM | POA: Diagnosis not present

## 2016-03-28 DIAGNOSIS — L578 Other skin changes due to chronic exposure to nonionizing radiation: Secondary | ICD-10-CM | POA: Diagnosis not present

## 2016-03-28 DIAGNOSIS — L570 Actinic keratosis: Secondary | ICD-10-CM | POA: Diagnosis not present

## 2016-03-28 DIAGNOSIS — L218 Other seborrheic dermatitis: Secondary | ICD-10-CM | POA: Diagnosis not present

## 2016-04-11 DIAGNOSIS — H2511 Age-related nuclear cataract, right eye: Secondary | ICD-10-CM | POA: Diagnosis not present

## 2016-04-25 DIAGNOSIS — L218 Other seborrheic dermatitis: Secondary | ICD-10-CM | POA: Diagnosis not present

## 2016-04-25 DIAGNOSIS — D485 Neoplasm of uncertain behavior of skin: Secondary | ICD-10-CM | POA: Diagnosis not present

## 2016-05-06 DIAGNOSIS — M170 Bilateral primary osteoarthritis of knee: Secondary | ICD-10-CM | POA: Diagnosis not present

## 2016-05-09 DIAGNOSIS — C44329 Squamous cell carcinoma of skin of other parts of face: Secondary | ICD-10-CM | POA: Diagnosis not present

## 2016-05-23 DIAGNOSIS — Z01818 Encounter for other preprocedural examination: Secondary | ICD-10-CM | POA: Diagnosis not present

## 2016-05-30 DIAGNOSIS — M170 Bilateral primary osteoarthritis of knee: Secondary | ICD-10-CM | POA: Diagnosis not present

## 2016-05-30 DIAGNOSIS — E782 Mixed hyperlipidemia: Secondary | ICD-10-CM | POA: Diagnosis not present

## 2016-05-30 DIAGNOSIS — I10 Essential (primary) hypertension: Secondary | ICD-10-CM | POA: Diagnosis not present

## 2016-05-30 DIAGNOSIS — Z01818 Encounter for other preprocedural examination: Secondary | ICD-10-CM | POA: Diagnosis not present

## 2016-05-30 DIAGNOSIS — G8929 Other chronic pain: Secondary | ICD-10-CM | POA: Diagnosis not present

## 2016-05-30 DIAGNOSIS — M25561 Pain in right knee: Secondary | ICD-10-CM | POA: Diagnosis not present

## 2016-05-30 DIAGNOSIS — R9431 Abnormal electrocardiogram [ECG] [EKG]: Secondary | ICD-10-CM | POA: Diagnosis not present

## 2016-05-30 DIAGNOSIS — E119 Type 2 diabetes mellitus without complications: Secondary | ICD-10-CM | POA: Diagnosis not present

## 2016-06-06 DIAGNOSIS — Z01818 Encounter for other preprocedural examination: Secondary | ICD-10-CM | POA: Diagnosis not present

## 2016-06-06 DIAGNOSIS — I158 Other secondary hypertension: Secondary | ICD-10-CM | POA: Diagnosis not present

## 2016-06-06 DIAGNOSIS — E789 Disorder of lipoprotein metabolism, unspecified: Secondary | ICD-10-CM | POA: Diagnosis not present

## 2016-06-11 DIAGNOSIS — M1711 Unilateral primary osteoarthritis, right knee: Secondary | ICD-10-CM | POA: Diagnosis not present

## 2016-06-11 DIAGNOSIS — Z79899 Other long term (current) drug therapy: Secondary | ICD-10-CM | POA: Diagnosis not present

## 2016-06-11 DIAGNOSIS — I10 Essential (primary) hypertension: Secondary | ICD-10-CM | POA: Diagnosis not present

## 2016-06-11 DIAGNOSIS — E785 Hyperlipidemia, unspecified: Secondary | ICD-10-CM | POA: Diagnosis not present

## 2016-06-11 DIAGNOSIS — E7800 Pure hypercholesterolemia, unspecified: Secondary | ICD-10-CM | POA: Diagnosis present

## 2016-06-11 DIAGNOSIS — E119 Type 2 diabetes mellitus without complications: Secondary | ICD-10-CM | POA: Diagnosis not present

## 2016-06-11 DIAGNOSIS — Z96641 Presence of right artificial hip joint: Secondary | ICD-10-CM | POA: Diagnosis not present

## 2016-06-11 DIAGNOSIS — M25561 Pain in right knee: Secondary | ICD-10-CM | POA: Diagnosis not present

## 2016-06-11 DIAGNOSIS — Z87891 Personal history of nicotine dependence: Secondary | ICD-10-CM | POA: Diagnosis not present

## 2016-06-11 DIAGNOSIS — G5603 Carpal tunnel syndrome, bilateral upper limbs: Secondary | ICD-10-CM | POA: Diagnosis present

## 2016-06-11 DIAGNOSIS — D72829 Elevated white blood cell count, unspecified: Secondary | ICD-10-CM | POA: Diagnosis not present

## 2016-06-11 DIAGNOSIS — M109 Gout, unspecified: Secondary | ICD-10-CM | POA: Diagnosis not present

## 2016-06-11 DIAGNOSIS — Z6839 Body mass index (BMI) 39.0-39.9, adult: Secondary | ICD-10-CM | POA: Diagnosis not present

## 2016-06-11 DIAGNOSIS — Z96651 Presence of right artificial knee joint: Secondary | ICD-10-CM | POA: Diagnosis not present

## 2016-06-11 DIAGNOSIS — Z96653 Presence of artificial knee joint, bilateral: Secondary | ICD-10-CM | POA: Diagnosis present

## 2016-06-11 DIAGNOSIS — G8918 Other acute postprocedural pain: Secondary | ICD-10-CM | POA: Diagnosis not present

## 2016-06-11 DIAGNOSIS — Z8249 Family history of ischemic heart disease and other diseases of the circulatory system: Secondary | ICD-10-CM | POA: Diagnosis not present

## 2016-06-11 DIAGNOSIS — E669 Obesity, unspecified: Secondary | ICD-10-CM | POA: Diagnosis not present

## 2016-06-11 DIAGNOSIS — G4730 Sleep apnea, unspecified: Secondary | ICD-10-CM | POA: Diagnosis present

## 2016-06-11 DIAGNOSIS — Z7982 Long term (current) use of aspirin: Secondary | ICD-10-CM | POA: Diagnosis not present

## 2016-06-15 DIAGNOSIS — I10 Essential (primary) hypertension: Secondary | ICD-10-CM | POA: Diagnosis not present

## 2016-06-15 DIAGNOSIS — G4730 Sleep apnea, unspecified: Secondary | ICD-10-CM | POA: Diagnosis not present

## 2016-06-15 DIAGNOSIS — E119 Type 2 diabetes mellitus without complications: Secondary | ICD-10-CM | POA: Diagnosis not present

## 2016-06-15 DIAGNOSIS — Z96651 Presence of right artificial knee joint: Secondary | ICD-10-CM | POA: Diagnosis not present

## 2016-06-15 DIAGNOSIS — Z471 Aftercare following joint replacement surgery: Secondary | ICD-10-CM | POA: Diagnosis not present

## 2016-06-15 DIAGNOSIS — M1000 Idiopathic gout, unspecified site: Secondary | ICD-10-CM | POA: Diagnosis not present

## 2016-06-17 DIAGNOSIS — I10 Essential (primary) hypertension: Secondary | ICD-10-CM | POA: Diagnosis not present

## 2016-06-17 DIAGNOSIS — E119 Type 2 diabetes mellitus without complications: Secondary | ICD-10-CM | POA: Diagnosis not present

## 2016-06-17 DIAGNOSIS — M1000 Idiopathic gout, unspecified site: Secondary | ICD-10-CM | POA: Diagnosis not present

## 2016-06-17 DIAGNOSIS — G4730 Sleep apnea, unspecified: Secondary | ICD-10-CM | POA: Diagnosis not present

## 2016-06-17 DIAGNOSIS — Z96651 Presence of right artificial knee joint: Secondary | ICD-10-CM | POA: Diagnosis not present

## 2016-06-17 DIAGNOSIS — Z471 Aftercare following joint replacement surgery: Secondary | ICD-10-CM | POA: Diagnosis not present

## 2016-06-18 DIAGNOSIS — G4730 Sleep apnea, unspecified: Secondary | ICD-10-CM | POA: Diagnosis not present

## 2016-06-18 DIAGNOSIS — E119 Type 2 diabetes mellitus without complications: Secondary | ICD-10-CM | POA: Diagnosis not present

## 2016-06-18 DIAGNOSIS — I10 Essential (primary) hypertension: Secondary | ICD-10-CM | POA: Diagnosis not present

## 2016-06-18 DIAGNOSIS — Z96651 Presence of right artificial knee joint: Secondary | ICD-10-CM | POA: Diagnosis not present

## 2016-06-18 DIAGNOSIS — Z471 Aftercare following joint replacement surgery: Secondary | ICD-10-CM | POA: Diagnosis not present

## 2016-06-18 DIAGNOSIS — M1000 Idiopathic gout, unspecified site: Secondary | ICD-10-CM | POA: Diagnosis not present

## 2016-06-19 DIAGNOSIS — I10 Essential (primary) hypertension: Secondary | ICD-10-CM | POA: Diagnosis not present

## 2016-06-19 DIAGNOSIS — G4730 Sleep apnea, unspecified: Secondary | ICD-10-CM | POA: Diagnosis not present

## 2016-06-19 DIAGNOSIS — Z471 Aftercare following joint replacement surgery: Secondary | ICD-10-CM | POA: Diagnosis not present

## 2016-06-19 DIAGNOSIS — M1000 Idiopathic gout, unspecified site: Secondary | ICD-10-CM | POA: Diagnosis not present

## 2016-06-19 DIAGNOSIS — E119 Type 2 diabetes mellitus without complications: Secondary | ICD-10-CM | POA: Diagnosis not present

## 2016-06-19 DIAGNOSIS — Z96651 Presence of right artificial knee joint: Secondary | ICD-10-CM | POA: Diagnosis not present

## 2016-06-20 DIAGNOSIS — I10 Essential (primary) hypertension: Secondary | ICD-10-CM | POA: Diagnosis not present

## 2016-06-20 DIAGNOSIS — G4730 Sleep apnea, unspecified: Secondary | ICD-10-CM | POA: Diagnosis not present

## 2016-06-20 DIAGNOSIS — E119 Type 2 diabetes mellitus without complications: Secondary | ICD-10-CM | POA: Diagnosis not present

## 2016-06-20 DIAGNOSIS — Z96651 Presence of right artificial knee joint: Secondary | ICD-10-CM | POA: Diagnosis not present

## 2016-06-20 DIAGNOSIS — M1000 Idiopathic gout, unspecified site: Secondary | ICD-10-CM | POA: Diagnosis not present

## 2016-06-20 DIAGNOSIS — Z471 Aftercare following joint replacement surgery: Secondary | ICD-10-CM | POA: Diagnosis not present

## 2016-06-21 DIAGNOSIS — I10 Essential (primary) hypertension: Secondary | ICD-10-CM | POA: Diagnosis not present

## 2016-06-21 DIAGNOSIS — Z96651 Presence of right artificial knee joint: Secondary | ICD-10-CM | POA: Diagnosis not present

## 2016-06-21 DIAGNOSIS — M1000 Idiopathic gout, unspecified site: Secondary | ICD-10-CM | POA: Diagnosis not present

## 2016-06-21 DIAGNOSIS — Z471 Aftercare following joint replacement surgery: Secondary | ICD-10-CM | POA: Diagnosis not present

## 2016-06-21 DIAGNOSIS — E119 Type 2 diabetes mellitus without complications: Secondary | ICD-10-CM | POA: Diagnosis not present

## 2016-06-21 DIAGNOSIS — G4730 Sleep apnea, unspecified: Secondary | ICD-10-CM | POA: Diagnosis not present

## 2016-06-24 DIAGNOSIS — Z471 Aftercare following joint replacement surgery: Secondary | ICD-10-CM | POA: Diagnosis not present

## 2016-06-24 DIAGNOSIS — Z96651 Presence of right artificial knee joint: Secondary | ICD-10-CM | POA: Diagnosis not present

## 2016-06-24 DIAGNOSIS — M1000 Idiopathic gout, unspecified site: Secondary | ICD-10-CM | POA: Diagnosis not present

## 2016-06-24 DIAGNOSIS — E119 Type 2 diabetes mellitus without complications: Secondary | ICD-10-CM | POA: Diagnosis not present

## 2016-06-24 DIAGNOSIS — I10 Essential (primary) hypertension: Secondary | ICD-10-CM | POA: Diagnosis not present

## 2016-06-24 DIAGNOSIS — G4730 Sleep apnea, unspecified: Secondary | ICD-10-CM | POA: Diagnosis not present

## 2016-06-25 DIAGNOSIS — Z96651 Presence of right artificial knee joint: Secondary | ICD-10-CM | POA: Diagnosis not present

## 2016-06-25 DIAGNOSIS — G4730 Sleep apnea, unspecified: Secondary | ICD-10-CM | POA: Diagnosis not present

## 2016-06-25 DIAGNOSIS — Z471 Aftercare following joint replacement surgery: Secondary | ICD-10-CM | POA: Diagnosis not present

## 2016-06-25 DIAGNOSIS — E119 Type 2 diabetes mellitus without complications: Secondary | ICD-10-CM | POA: Diagnosis not present

## 2016-06-25 DIAGNOSIS — M1000 Idiopathic gout, unspecified site: Secondary | ICD-10-CM | POA: Diagnosis not present

## 2016-06-25 DIAGNOSIS — I10 Essential (primary) hypertension: Secondary | ICD-10-CM | POA: Diagnosis not present

## 2016-06-26 DIAGNOSIS — M1000 Idiopathic gout, unspecified site: Secondary | ICD-10-CM | POA: Diagnosis not present

## 2016-06-26 DIAGNOSIS — Z471 Aftercare following joint replacement surgery: Secondary | ICD-10-CM | POA: Diagnosis not present

## 2016-06-26 DIAGNOSIS — G4730 Sleep apnea, unspecified: Secondary | ICD-10-CM | POA: Diagnosis not present

## 2016-06-26 DIAGNOSIS — I10 Essential (primary) hypertension: Secondary | ICD-10-CM | POA: Diagnosis not present

## 2016-06-26 DIAGNOSIS — Z96651 Presence of right artificial knee joint: Secondary | ICD-10-CM | POA: Diagnosis not present

## 2016-06-26 DIAGNOSIS — E119 Type 2 diabetes mellitus without complications: Secondary | ICD-10-CM | POA: Diagnosis not present

## 2016-06-27 DIAGNOSIS — Z471 Aftercare following joint replacement surgery: Secondary | ICD-10-CM | POA: Diagnosis not present

## 2016-06-27 DIAGNOSIS — I10 Essential (primary) hypertension: Secondary | ICD-10-CM | POA: Diagnosis not present

## 2016-06-27 DIAGNOSIS — G4730 Sleep apnea, unspecified: Secondary | ICD-10-CM | POA: Diagnosis not present

## 2016-06-27 DIAGNOSIS — Z96651 Presence of right artificial knee joint: Secondary | ICD-10-CM | POA: Diagnosis not present

## 2016-06-27 DIAGNOSIS — M1000 Idiopathic gout, unspecified site: Secondary | ICD-10-CM | POA: Diagnosis not present

## 2016-06-27 DIAGNOSIS — E119 Type 2 diabetes mellitus without complications: Secondary | ICD-10-CM | POA: Diagnosis not present

## 2016-06-28 DIAGNOSIS — M1000 Idiopathic gout, unspecified site: Secondary | ICD-10-CM | POA: Diagnosis not present

## 2016-06-28 DIAGNOSIS — I10 Essential (primary) hypertension: Secondary | ICD-10-CM | POA: Diagnosis not present

## 2016-06-28 DIAGNOSIS — E119 Type 2 diabetes mellitus without complications: Secondary | ICD-10-CM | POA: Diagnosis not present

## 2016-06-28 DIAGNOSIS — Z96651 Presence of right artificial knee joint: Secondary | ICD-10-CM | POA: Diagnosis not present

## 2016-06-28 DIAGNOSIS — Z471 Aftercare following joint replacement surgery: Secondary | ICD-10-CM | POA: Diagnosis not present

## 2016-06-28 DIAGNOSIS — G4730 Sleep apnea, unspecified: Secondary | ICD-10-CM | POA: Diagnosis not present

## 2016-07-01 DIAGNOSIS — E119 Type 2 diabetes mellitus without complications: Secondary | ICD-10-CM | POA: Diagnosis not present

## 2016-07-01 DIAGNOSIS — Z96651 Presence of right artificial knee joint: Secondary | ICD-10-CM | POA: Diagnosis not present

## 2016-07-01 DIAGNOSIS — G4730 Sleep apnea, unspecified: Secondary | ICD-10-CM | POA: Diagnosis not present

## 2016-07-01 DIAGNOSIS — M1000 Idiopathic gout, unspecified site: Secondary | ICD-10-CM | POA: Diagnosis not present

## 2016-07-01 DIAGNOSIS — Z471 Aftercare following joint replacement surgery: Secondary | ICD-10-CM | POA: Diagnosis not present

## 2016-07-01 DIAGNOSIS — I10 Essential (primary) hypertension: Secondary | ICD-10-CM | POA: Diagnosis not present

## 2016-07-02 DIAGNOSIS — Z96651 Presence of right artificial knee joint: Secondary | ICD-10-CM | POA: Diagnosis not present

## 2016-07-02 DIAGNOSIS — Z471 Aftercare following joint replacement surgery: Secondary | ICD-10-CM | POA: Diagnosis not present

## 2016-07-02 DIAGNOSIS — M1000 Idiopathic gout, unspecified site: Secondary | ICD-10-CM | POA: Diagnosis not present

## 2016-07-02 DIAGNOSIS — E119 Type 2 diabetes mellitus without complications: Secondary | ICD-10-CM | POA: Diagnosis not present

## 2016-07-02 DIAGNOSIS — I10 Essential (primary) hypertension: Secondary | ICD-10-CM | POA: Diagnosis not present

## 2016-07-02 DIAGNOSIS — G4730 Sleep apnea, unspecified: Secondary | ICD-10-CM | POA: Diagnosis not present

## 2016-07-03 DIAGNOSIS — I10 Essential (primary) hypertension: Secondary | ICD-10-CM | POA: Diagnosis not present

## 2016-07-03 DIAGNOSIS — E119 Type 2 diabetes mellitus without complications: Secondary | ICD-10-CM | POA: Diagnosis not present

## 2016-07-03 DIAGNOSIS — M1000 Idiopathic gout, unspecified site: Secondary | ICD-10-CM | POA: Diagnosis not present

## 2016-07-03 DIAGNOSIS — Z96651 Presence of right artificial knee joint: Secondary | ICD-10-CM | POA: Diagnosis not present

## 2016-07-03 DIAGNOSIS — G4730 Sleep apnea, unspecified: Secondary | ICD-10-CM | POA: Diagnosis not present

## 2016-07-03 DIAGNOSIS — Z471 Aftercare following joint replacement surgery: Secondary | ICD-10-CM | POA: Diagnosis not present

## 2016-07-05 DIAGNOSIS — I10 Essential (primary) hypertension: Secondary | ICD-10-CM | POA: Diagnosis not present

## 2016-07-05 DIAGNOSIS — G4730 Sleep apnea, unspecified: Secondary | ICD-10-CM | POA: Diagnosis not present

## 2016-07-05 DIAGNOSIS — E119 Type 2 diabetes mellitus without complications: Secondary | ICD-10-CM | POA: Diagnosis not present

## 2016-07-05 DIAGNOSIS — Z471 Aftercare following joint replacement surgery: Secondary | ICD-10-CM | POA: Diagnosis not present

## 2016-07-05 DIAGNOSIS — M1000 Idiopathic gout, unspecified site: Secondary | ICD-10-CM | POA: Diagnosis not present

## 2016-07-05 DIAGNOSIS — Z96651 Presence of right artificial knee joint: Secondary | ICD-10-CM | POA: Diagnosis not present

## 2016-07-09 ENCOUNTER — Inpatient Hospital Stay
Admit: 2016-07-09 | Payer: MEDICARE | Attending: Rehabilitative and Restorative Service Providers" | Primary: Family Medicine

## 2016-07-09 ENCOUNTER — Encounter: Primary: Family Medicine

## 2016-07-09 DIAGNOSIS — M25561 Pain in right knee: Secondary | ICD-10-CM | POA: Diagnosis not present

## 2016-07-09 NOTE — Progress Notes (Signed)
PT DAILY TREATMENT NOTE - MCR 12-16    Patient Name: Jason Nixon  Date:07/09/2016  DOB: 1940/09/17  [x]   Patient DOB Verified  [place]or: [place] MEDICARE / Plan: [place] MEDICARE PART A & B / Product Type: Medicare /    In time:853  Out time:926  Total Treatment Time (min): 33  Total Timed Codes (min): 23  1:1 Treatment Time (MC only): 33   Visit #: 1 of 24    Treatment Area: Pain in right knee [M25.561]    SUBJECTIVE  Pain Level (0-10 scale): 3-4/10  Any medication changes, allergies to medications, adverse drug reactions, diagnosis change, or new procedure performed?: [x]  No    []  Yes (see summary sheet for update)  Subjective functional status/changes:   []  No changes reported  HPI: Pt c/o increased pain in the right knee s/p TKR on 06/11/16. Reports he stayed 3 nights in the hospital before heading home where he completed 3 weeks HHPT. Reports this weekend he did not continue with his HEP like usual and he sat a lot while spending time with family which he believes increased his pain and swelling. States prior to this weekend he was off the cane for household distances. Reports he has been driving but today the driving increased his pain. Reports he uses ice and elevation to improve pain. Pain increases after he's been sitting for a while and has to stand up. Reports he is on an aspirin regime of 2 pills twice a day and is taking antibiotics. Reports he lives w/ his wife. 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 Denies use of AD prior to surgery.     OBJECTIVE    Modality rationale: decrease edema, decrease inflammation and decrease pain to improve the patient???s ability to improve gait and activity tolerance    Min Type Additional Details    []  Estim:  [] Unatt       [] IFC  [] Premod                        [] Other:  [] w/ice   [] w/heat  Position:  Location:    []  Estim: [] Att    [] TENS instruct  [] NMES                    [] Other:  [] w/US   [] w/ice   [] w/heat  Position:  Location:     []   Traction: []  Cervical       [] Lumbar                       []  Prone          [] Supine                       [] Intermittent   [] Continuous Lbs:  []  before manual  []  after manual    []   Ultrasound: [] Continuous   []  Pulsed                           []   [] W/cm2:  Location:    []   Iontophoresis with dexamethasone         Location: []  Take home patch   []  In clinic   8 (w/ HEP instruction) [x]   Ice     []   heat  []   Ice massage  []   Laser   []   Anodyne Position: supine w/  elevation  Location: right knee    []   Laser with stim  []   Other:  Position:  Location:    []   Vasopneumatic Device Pressure:       []  lo []  med []  hi   Temperature: []  lo []  med []  hi   []  Skin assessment post-treatment:  [] intact [] redness- no adverse reaction    [] redness ??? adverse reaction:     10 min [x] Eval                  [] Re-Eval       13 min Therapeutic Exercise:  []  See flow sheet : instructed in and demo'd HEP, educated in importance of HEP completion to decrease pain and improve gait, reviewed current HEP   Rationale: increase ROM, increase strength, improve coordination and increase proprioception to improve the patient???s ability to decrease pain and improve function     min Therapeutic Activity:  []   See flow sheet :   Rationale:   to improve the patient???s ability to       min Neuromuscular Re-education:  []   See flow sheet :   Rationale:   to improve the patient???s ability to     10 min Manual Therapy:  Patellar mobs, incision inspection, knee flexion mobs, TPR/DTM to vastus lateralis/ITB   Rationale: decrease pain, increase ROM, increase tissue extensibility, decrease edema  and decrease trigger points to improve gait and activity tolerance      min Gait Training:  ___ feet with ___ device on level surfaces with ___ level of assist   Rationale:          With   []  TE   []  TA   []  neuro   []  other: Patient Education: [x]  Review HEP    []  Progressed/Changed HEP based on:    []  positioning   []  body mechanics   []  transfers   []  heat/ice application    []  other:      Other Objective/Functional Measures: Pt presents w/ antalgic gait and SPC left hand. Incision is healing well w/ no drainage, warmth or redness, bandage is slowly coming off. AROM right knee = 7-91 deg, left knee flexion = 112 deg. Good patellar mobility noted along w/ fair to good quad set, noted fatigue after 3 SLR w/minimal lag present. MMT right hip flex 3+/5 w/ increased pain, abd -4/5, ext 3+/5, knee flex 4/5, ext 4/5, ankle DF 4+/5. Requires HHA for SLS > 4 seconds 2' pain.     Pain Level (0-10 scale) post treatment: 2-3/10    ASSESSMENT/Changes in Function: Reviewed current HEP and progressed, educated on importance of completing HEP and spending time w/ knee straight as well as bent to promote improved AROM and decreased edema.     Patient will continue to benefit from skilled PT services to modify and progress therapeutic interventions, address functional mobility deficits, address ROM deficits, address strength deficits, analyze and address soft tissue restrictions, analyze and cue movement patterns, analyze and modify body mechanics/ergonomics, address imbalance/dizziness and instruct in home and community integration to attain remaining goals.     [x]   See Plan of Care  []   See progress note/recertification  []   See Discharge Summary         Progress towards goals / Updated goals:  Short Term Goals: To be accomplished in 3 weeks:  1. Pt will be independent and complaint w/ HEP to progress gains in PT.   At eval: reviewed and progressed HEP  2. Pt will have AROM right knee to > or = to 0-120 deg for normalized gait.   At eval: AROM right knee 7-91   3. Pt will demo good quad set right w/o lag x10 SLR w/o increased pain for ease w/ curb navigation.   At eval: fair to good quad set, noted fatigue after 3 SLR w/minimal lag present  Long Term Goals: To be accomplished in 8 weeks:   1. Pt will improve FOTO to > or = to 60 to demo improved function.   At eval: FOTO = 48  2. Pt will improve MMT right hip and knee to > or = to 4+-5/5 w/o increased pain for ease w/ (I) community ambulation.   At eval: MMT right hip flex 3+/5 w/ increased pain, abd -4/5, ext 3+/5, knee flex 4/5, ext 4/5  3. Pt will improve balance as demo'd by SLS right for > or = to 20 seconds w/o HHA for ease w/ stair navigation.   At eval: Requires HHA for SLS > 4 seconds 2' pain  4. Pt will demo correct ambulation w/o AD for > or = to [id number] for ease w/ community navigation.   At eval: Pt presents w/ antalgic gait and SPC left hand    PLAN  []   Upgrade activities as tolerated     []   Continue plan of care  []   Update interventions per flow sheet       []   Discharge due to:_  [x]   Other: 3x/8 weeks      Lindi AdieJennifer L Terisa Belardo, PT 07/09/2016  9:30 AM    Future Appointments  Date Time Provider Department Center   07/11/2016 3:00 PM Westley GamblesVictoria L Miller, PT [facility]   07/12/2016 11:00 AM Lindi AdieJennifer L Anabell Swint, PT [facility]   07/17/2016 11:30 AM Westley GamblesVictoria L Miller, PT [facility]   07/19/2016 2:30 PM Lindi AdieJennifer L Katrell Milhorn, PT [facility]   07/23/2016 11:30 AM [facility]   07/26/2016 8:30 AM West[facility]lesVictoria L Miller, [facility]   07/30/2016 11:30 AM Lindi AdieJennifer L Shaquanda Graves, PT [facility]   08/02/2016 11:00 AM Lindi AdieJennifer L Amea Mcphail, PT [facility]   08/06/2016 9:00 AM Westley GamblesVictoria L Miller, PT [facility]   08/09/2016 10:30 AM Lindi AdieJennifer L Arshia Rondon, PT [facility]   08/13/2016 9:00 AM Westley GamblesVictoria L Miller, PT [facility]   08/16/2016 9:00 AM Lindi AdieJennifer L Amandy Chubbuck, PT [facility]

## 2016-07-09 NOTE — Progress Notes (Signed)
[facility] ??? [facility]              [address]        [place], [place] [id number]  [phone]   [phone] fax    Plan of Care/ Statement of Necessity for Physical Therapy Services    Patient name: Jason LedgerRobert Nixon Start of Care: 07/09/2016   Referral source: Carolin CoyBevilacqua, Anthony M, * DOB: Jun 23, 1940    Medical Diagnosis: Pain in right knee [M25.561]   Onset Date:06/11/16    Treatment Diagnosis: s/p left TKR   Prior Hospitalization: see medical history Provider#: [id number]   Medications: Verified on Patient summary List    Comorbidities: arthritis, BMI > 30, CA, DM, HBP, sleep dysfunction    Prior Level of Function: (I) w/ gait, pain in right knee, hx left TKR, lives w/ wife, no limitations w/ MADLs      The Plan of Care and following information is based on the information from the initial evaluation.  Assessment/ key information: Pt is a 76 y/o male w/c/o increased pain in the right knee s/p TKR on 06/11/16. Reports he stayed 3 nights in the hospital before heading home where he completed 3 weeks HHPT. Reports this weekend he did not continue with his HEP like usual and he sat a lot while spending time with family which he believes increased his pain and swelling. States prior to this weekend he was off the cane for household distances. Reports he has been driving but today the driving increased his pain. Reports he uses ice and elevation to improve pain. Pain increases after he's been sitting for a while and has to stand up. Reports he is on an aspirin regime of 2 pills twice a day and is taking antibiotics. Reports he lives w/ his wife. 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 Denies use of AD prior to surgery. Pt presents w/ antalgic gait and SPC left hand. Incision is healing well w/ no drainage, warmth or redness, bandage is slowly coming off. AROM right knee = 7-91 deg, left knee flexion = 112 deg. Good patellar mobility noted along w/ fair to good  quad set, noted fatigue after 3 SLR w/minimal lag present. MMT right hip flex 3+/5 w/ increased pain, abd -4/5, ext 3+/5, knee flex 4/5, ext 4/5, ankle DF 4+/5. Requires HHA for SLS > 4 seconds 2' pain. Pt will benefit from skilled PT to address the deficits and progress with pt goals.    Evaluation Complexity History MEDIUM  Complexity : 1-2 comorbidities / personal factors will impact the outcome/ POC ; Examination MEDIUM Complexity : 3 Standardized tests and measures addressing body structure, function, activity limitation and / or participation in recreation  ;Presentation MEDIUM Complexity : Evolving with changing characteristics  ;Clinical Decision Making MEDIUM Complexity : FOTO score of 26-74  Overall Complexity Rating: MEDIUM  Problem List: pain affecting function, decrease ROM, decrease strength, edema affecting function, impaired gait/ balance, decrease ADL/ functional abilitiies, decrease activity tolerance, decrease flexibility/ joint mobility and decrease transfer abilities   Treatment Plan may include any combination of the following: Therapeutic exercise, Therapeutic activities, Neuromuscular re-education, Physical agent/modality, Gait/balance training, Manual therapy, Patient education, Functional mobility training and Stair training  Patient / Family readiness to learn indicated by: asking questions, trying to perform skills and interest  Persons(s) to be included in education: patient (P)  Barriers to Learning/Limitations: None  Patient Goal (s): ???stop the pain and improve walking???  Patient Self Reported Health Status: good  Rehabilitation Potential: good    Short Term Goals: To be accomplished in 3 weeks:  1. Pt will be independent and complaint w/ HEP to progress gains in PT.   2. Pt will have AROM right knee to > or = to 0-120 deg for normalized gait.   3. Pt will demo good quad set right w/o lag x10 SLR w/o increased pain for ease w/ curb navigation.      Long Term Goals: To be accomplished in 8 weeks:  1. Pt will improve FOTO to > or = to 60 to demo improved function.   2. Pt will improve MMT right hip and knee to > or = to 4+-5/5 w/o increased pain for ease w/ (I) community ambulation.   3. Pt will improve balance as demo'd by SLS right for > or = to 20 seconds w/o HHA for ease w/ stair navigation.   4. Pt will demo correct ambulation w/o AD for > or = to 540ft for ease w/ community navigation.     Frequency / Duration: Patient to be seen 3 times per week for 8 weeks.    Patient/ Caregiver education and instruction: Diagnosis, prognosis, activity modification and exercises   [x]   Plan of care has been reviewed with PTA    G-Codes (GP)  Mobility  [phone] Current  CK= 40-59%  [id number] Goal  CJ= 20-39%    The severity rating is based on clinical judgment and the FOTO score.    Certification Period: 07/09/16 - 10/07/16  Lindi Adie, PT 07/09/2016 9:43 AM  ________________________________________________________________________    I certify that the above Therapy Services are being furnished while the patient is under my care. I agree with the treatment plan and certify that this therapy is necessary.    Physician's Signature:____________________  Date:____________Time: _________    Please sign and return to [facility] ??? [facility]              [address]        [place], [place] [address]  [phone]   [phone] fax

## 2016-07-11 ENCOUNTER — Inpatient Hospital Stay: Admit: 2016-07-11 | Payer: MEDICARE | Primary: Family Medicine

## 2016-07-11 DIAGNOSIS — M25561 Pain in right knee: Secondary | ICD-10-CM | POA: Diagnosis not present

## 2016-07-11 NOTE — Progress Notes (Signed)
PT DAILY TREATMENT NOTE - MCR 12-16    Patient Name: Jason Nixon  Date:07/11/2016  DOB: 1940-08-30  _0   Patient DOB Verified  [place]: [place] / Plan: [place] MED[place] PART A & B / Product Type: Medicare /    In time:0254  Out JARW:1100  Total Treatment Time (min): 43  Total Timed Codes (min): 43  1:1 Treatment Time ([place] only): 30   Visit #: 2 of 24    Treatment Area: Pain in right knee [M25.561]    SUBJECTIVE  Pain Level (0-10 scale): 2  Any medication changes, allergies to medications, adverse drug reactions, diagnosis change, or new procedure performed?: _1  No    _2  Yes (see summary sheet for update)  Subjective functional status/changes:   _3  No changes reported  Patient reports completion of prescribed HEP 3x/day    OBJECTIVE    20 min Therapeutic Exercise:  _4  See flow sheet : Emphasis placed on improving available knee AROM and improving strength of the LE musculature   Rationale: increase ROM and increase strength to improve the patient???s ability to improve ease with household ambulation    15 min Neuromuscular Re-education:  _5   See flow sheet : Emphasis placed on improving activation and recruitment of the quadriceps and gluteal musculature and improving LE proprioceptive and kinesthetic awareness   Rationale: increase ROM, increase strength, improve balance and increase proprioception  to improve the patient???s ability to improve safety with community ADLs    8 min Manual Therapy:    Supine, Right Femur AP Grade II-III Mobilization (terminal knee ext)  Supine, Right Hamstring Manual Stretch (90-90)  Supine, Right Knee Manually-Resisted TKE   Rationale: decrease pain, increase ROM and increase tissue extensibility to improve ease with stair management.    With   _6  TE   _7  TA   _8  neuro   _9  other: Patient Education: _10  Review HEP    _11  Progressed/Changed HEP based on:   _12  positioning   _13  body mechanics   _14  transfers   <PEJYLTEIHDTPNSQZ>_8<\/TMMITVIFXGXIVHSJ>_29  heat/ice application    <GRMBOBOFPULGSPJS>_4<\/NHRVACQPEAKLTYVD>_73  other:      Pain Level (0-10 scale) post treatment: 3     ASSESSMENT/Changes in Function: Significant improvement in right knee flexion demonstrated with inability to achieve terminal knee extension with patient demonstrating observationally hypertonicity of the hamstring musculature. Unsymmetrical weightbearing noted with completion of bilateral squat.    Patient will continue to benefit from skilled PT services to modify and progress therapeutic interventions, address functional mobility deficits, address ROM deficits, address strength deficits, analyze and address soft tissue restrictions, analyze and cue movement patterns and analyze and modify body mechanics/ergonomics to attain remaining goals.     _17   See Plan of Care  _18   See progress note/recertification  <AQVOHCSPZZCKICHT>_9<\/GVSYVGCYOYOOJZBF>_01   See Discharge Summary         Progress towards goals / Updated goals:    Short Term Goals:??To be accomplished in 3??weeks:  1. Pt will be independent and complaint w/ HEP to progress gains in PT.   At eval: reviewed and progressed HEP  Current: Met, HEP performance reported as prescribed, 07/11/2016  2. Pt will have AROM right knee to >??or = to 0-120 deg for normalized gait.   At eval: AROM right knee 7-91   Current: Progressing, Right Knee AROM 0-7-105 degrees, 07/11/2016  3. Pt will demo good quad set right w/o lag x10 SLR w/o increased pain for ease w/ curb navigation.   At eval: fair to good quad set, noted fatigue  after 3 SLR w/minimal lag present  Long Term Goals:??To be accomplished in 8??weeks:  1. Pt will improve FOTO to >??or = to 60 to demo improved function.   At eval: FOTO = 48  2. Pt will improve MMT right hip and knee to >??or = to 4+-5/5 w/o increased pain for ease w/ (I) community ambulation.   At eval: MMT right hip flex 3+/5 w/ increased pain, abd -4/5, ext 3+/5, knee flex 4/5, ext 4/5  3. Pt will improve balance as demo'd by SLS right for >??or = to 20 seconds w/o HHA for ease w/ stair navigation.   At eval: Requires HHA for SLS > 4 seconds 2' pain   4. Pt will demo correct ambulation w/o AD for >??or = to 521f for ease w/ community navigation.   At eval: Pt presents w/ antalgic gait and SPC left hand    PLAN  _0   Upgrade activities as tolerated     _1   Continue plan of care  _2   Update interventions per flow sheet       _3   Discharge due to:_  _4   Other:_      VKara Dies PT 07/11/2016  8:43 AM    Future Appointments  Date Time Provider [place]  07/11/2016 3:00 PM [facility]  07/12/2016 11:00 AM [facility]  07/17/2016 11:30 AM [facility]  07/19/2016 2:30 PM [facility]  07/23/2016 11:30 AM [place] [facility] [place]  07/26/2016 8:30 AM [facility] Dies [facility]  07/29/2016 8:30 AM [facility]  07/30/2016 11:30 AM [facility]  08/02/2016 11:00 AM [facility]annette How PT [facility]  08/06/2016 9:00 AM [facility]  08/08/2016 8:30 AM [facility]  08/09/2016 10:30 AM [facility]  08/13/2016 9:00 AM [facility]  08/15/2016 8:00 AM [facility]  08/16/2016 9:00 AM [facility]ette How PT [facility]  08/19/2016 8:00 AM [facility]  08/21/2016 9:00 AM [facility]annette How PT [facility]  08/23/2016 8:00 AM [facility] Dies [facility]  08/26/2016 8:00 AM [facility] Dies [facility]  08/28/2016 9:00 AM JJeannette How PT [facility]  08/30/2016 8:00 AM [facility] Dies [facility]

## 2016-07-12 ENCOUNTER — Inpatient Hospital Stay
Admit: 2016-07-12 | Payer: MEDICARE | Attending: Rehabilitative and Restorative Service Providers" | Primary: Family Medicine

## 2016-07-12 DIAGNOSIS — M25561 Pain in right knee: Secondary | ICD-10-CM | POA: Diagnosis not present

## 2016-07-12 NOTE — Progress Notes (Signed)
PT DAILY TREATMENT NOTE - MCR 12-16    Patient Name: Jason Nixon  Date:07/12/2016  DOB: [birth year]  '[x]'   Patient DOB Verified  [place]: [place] MED[place] / Plan: [place] MEDICARE PART A & B / Product Type: Medicare /    In time:1100  Out time:1155  Total Treatment Time (min): 55  Total Timed Codes (min): 45  1:1 Treatment Time ([place] only): 61   Visit #: 3 of 24    Treatment Area: Pain in right knee [M25.561]    SUBJECTIVE  Pain Level (0-10 scale): 2-3/10  Any medication changes, allergies to medications, adverse drug reactions, diagnosis change, or new procedure performed?: '[x]'  No    '[]'  Yes (see summary sheet for update)  Subjective functional status/changes:   '[]'  No changes reported  Pt reports no extra soreness following last visit, compliant w/ HEP 3x/day    OBJECTIVE    Modality rationale: decrease edema, decrease inflammation and decrease pain to improve the patient???s ability to improve post workout soreness and normalize gait    Min Type Additional Details    '[]'  Estim:  '[]' Unatt       '[]' IFC  '[]' Premod                        '[]' Other:  '[]' w/ice   '[]' w/heat  Position:  Location:    '[]'  Estim: '[]' Att    '[]' TENS instruct  '[]' NMES                    '[]' Other:  '[]' w/US   '[]' w/ice   '[]' w/heat  Position:  Location:    '[]'   Traction: '[]'  Cervical       '[]' Lumbar                       '[]'  Prone          '[]' Supine                       '[]' Intermittent   '[]' Continuous Lbs:  '[]'  before manual  '[]'  after manual    '[]'   Ultrasound: '[]' Continuous   '[]'  Pulsed                           '[]' 1MHz   '[]' 3MHz W/cm2:  Location:    '[]'   Iontophoresis with dexamethasone         Location: '[]'  Take home patch   '[]'  In clinic   10 '[x]'   Ice     '[]'   heat  '[]'   Ice massage  '[]'   Laser   '[]'   Anodyne Position: supine w/ elevation  Location: right knee    '[]'   Laser with stim  '[]'   Other:  Position:  Location:    '[]'   Vasopneumatic Device Pressure:       '[]'  lo '[]'  med '[]'  hi   Temperature: '[]'  lo '[]'  med '[]'  hi   '[]'  Skin assessment post-treatment:  '[]' intact '[]' redness- no adverse reaction     '[]' redness ??? adverse reaction:      min '[]' Eval                  '[]' Re-Eval       35 min Therapeutic Exercise:  '[x]'  See flow sheet : increased per flow sheet   Rationale: increase ROM, increase strength, improve coordination, improve balance and increase proprioception to improve the patient???s ability to improve ADLs and activity  tolerance      min Therapeutic Activity:  '[]'   See flow sheet :   Rationale:   to improve the patient???s ability to       min Neuromuscular Re-education:  '[]'   See flow sheet :   Rationale:   to improve the patient???s ability to     10 min Manual Therapy:  *Patellar mobs, incision inspection, knee flexion mobs, TPR/DTM to vastus lateralis/ITB, popliteal release   Rationale: decrease pain, increase ROM, increase tissue extensibility, decrease edema  and decrease trigger points to improve activity tolerance and gait     min Gait Training:  ___ feet with ___ device on level surfaces with ___ level of assist   Rationale:          With   '[]'  TE   '[]'  TA   '[]'  neuro   '[]'  other: Patient Education: '[x]'  Review HEP    '[]'  Progressed/Changed HEP based on:   '[]'  positioning   '[]'  body mechanics   '[]'  transfers   '[]'  heat/ice application    '[]'  other:      Other Objective/Functional Measures: AROM after manual = 5-105 deg     Pain Level (0-10 scale) post treatment: 3/10    ASSESSMENT/Changes in Function: Pt progressing well w/ ex's and toward goals. Noted increased popliteus tightness and pt's resting position in hip ER w/ slight knee flexion. Educated on positioning to avoid hip ER w/ knee flex. Pt verbalized understanding.     Patient will continue to benefit from skilled PT services to modify and progress therapeutic interventions, address functional mobility deficits, address ROM deficits, address strength deficits, analyze and address soft tissue restrictions, analyze and cue movement patterns, analyze and modify body mechanics/ergonomics, address imbalance/dizziness and instruct in  home and community integration to attain remaining goals.     '[]'   See Plan of Care  '[]'   See progress note/recertification  '[]'   See Discharge Summary         Progress towards goals / Updated goals:  Short Term Goals:??To be accomplished in 3??weeks:  1. Pt will be independent and complaint w/ HEP to progress gains in PT.   At eval: reviewed and progressed HEP  Current: Met, HEP performance reported as prescribed, 07/11/2016  2. Pt will have AROM right knee to >??or = to 0-120 deg for normalized gait.   At eval: AROM right knee 7-91   Current: Progressing, Right Knee AROM 0-7-105 degrees, 07/11/2016  3. Pt will demo good quad set right w/o lag x10 SLR w/o increased pain for ease w/ curb navigation.   At eval: fair to good quad set, noted fatigue after 3 SLR w/minimal lag present  Long Term Goals:??To be accomplished in 8??weeks:  1. Pt will improve FOTO to >??or = to 60 to demo improved function.   At eval: FOTO = 48  2. Pt will improve MMT right hip and knee to >??or = to 4+-5/5 w/o increased pain for ease w/ (I) community ambulation.   At eval: MMT right hip flex 3+/5 w/ increased pain, abd -4/5, ext 3+/5, knee flex 4/5, ext 4/5  3. Pt will improve balance as demo'd by SLS right for >??or = to 20 seconds w/o HHA for ease w/ stair navigation.   At eval: Requires HHA for SLS > 4 seconds 2' pain  4. Pt will demo correct ambulation w/o AD for >??or = to 563f for ease w/ community navigation.   At eval: Pt presents w/ antalgic gait  and SPC left hand    PLAN  '[x]'   Upgrade activities as tolerated     '[x]'   Continue plan of care  '[]'   Update interventions per flow sheet       '[]'   Discharge due to:_  '[]'   Other:_      Jeannette How, PT 07/12/2016  11:16 AM    Future Appointments  Date Time Provider [place]   07/17/2016 11:30 AM Kara Dies, PT [facility]   07/19/2016 2:30 PM Jeannette How, PT [facility] [address]   07/23/2016 11:30 AM [place] PT SUFFOLK 1 [facility] [place]   07/26/2016 8:30 AM Kara Dies, PT [facility]    07/29/2016 8:30 AM Kara Dies, PT [facility]   07/30/2016 11:30 AM Jeannette How, PT [facility]   08/02/2016 11:00 AM Jeannette How, PT [facility]   08/06/2016 9:00 AM Kara Dies, PT [facility]   08/08/2016 8:30 AM Kara Dies, PT [facility]   08/09/2016 10:30 AM Jeannette How, PT [facility]   08/13/2016 9:00 AM Kara Dies, PT [facility]   08/15/2016 8:00 AM Kara Dies, PT [facility]   08/16/2016 9:00 AM Jeannette How, PT [facility]   08/19/2016 8:00 AM Kara Dies, PT [facility]   08/21/2016 9:00 AM Jeannette How, PT [facility]   08/23/2016 8:00 AM Kara Dies, PT [facility]   08/26/2016 8:00 AM Kara Dies, PT [facility]   08/28/2016 9:00 AM Jeannette How, PT [facility]   08/30/2016 8:00 AM Kara Dies, PT [facility]

## 2016-07-15 ENCOUNTER — Inpatient Hospital Stay: Admit: 2016-07-15 | Payer: MEDICARE | Primary: Family Medicine

## 2016-07-15 DIAGNOSIS — M25561 Pain in right knee: Secondary | ICD-10-CM | POA: Diagnosis not present

## 2016-07-15 NOTE — Progress Notes (Signed)
PT DAILY TREATMENT NOTE - MCR 12-16    Patient Name: Jason Nixon  Date:07/15/2016  DOB: [birth year]  '[x]'   Patient DOB Verified  Payor: [place] MEDICARE / Plan: [place] MEDICARE PART A & B / Product Type: Medicare /    In time: 2:22   Out time: 3:10  Total Treatment Time (min): 48  Total Timed Codes (min): 48  1:1 Treatment Time ([place] only): 35  Visit #: 4 of 24    Treatment Area: Pain in right knee [M25.561]    SUBJECTIVE  Pain Level (0-10 scale): 2  Any medication changes, allergies to medications, adverse drug reactions, diagnosis change, or new procedure performed?: '[x]'  No    '[]'  Yes (see summary sheet for update)  Subjective functional status/changes:   '[]'  No changes reported  No changes reported today.     OBJECTIVE    36 min Therapeutic Exercise:  '[x]'  See flow sheet :   Rationale: increase ROM, increase strength, improve coordination, improve balance and increase proprioception to improve the patient???s ability to improve ADLs and activity tolerance     12 min Manual Therapy: right patellar mobs, right knee flex/EXT mobs, DTM right HS, scar massage, PROM into right knee flex with gentle overpressure.   Rationale: decrease pain, increase ROM, increase tissue extensibility, decrease edema  and decrease trigger points to improve activity tolerance and gait         With   '[]'  TE   '[]'  TA   '[]'  neuro   '[]'  other: Patient Education: '[x]'  Review HEP    '[]'  Progressed/Changed HEP based on:   '[]'  positioning   '[]'  body mechanics   '[]'  transfers   '[]'  heat/ice application    '[]'  other:      Other Objective/Functional Measures: Tightness noted in the right HS and around the scar. Increased step height to 6 inches for the step ups (forward/lateral) and step downs to improve ROM and strength of the right knee. Right knee AAROM after heel slides with belt 110 degs.      Pain Level (0-10 scale) post treatment: 2    ASSESSMENT/Changes in Function: Pt denied CP post session and stated he  would ice his knee at home. Reported no increased pain reported with exercises today. Improvement in right knee ROM noted and will continue therapy to improve ROM and overall mobility. Continue POC as tolerated.    Patient will continue to benefit from skilled PT services to modify and progress therapeutic interventions, address functional mobility deficits, address ROM deficits, address strength deficits, analyze and address soft tissue restrictions, analyze and cue movement patterns, analyze and modify body mechanics/ergonomics, address imbalance/dizziness and instruct in home and community integration to attain remaining goals.     '[]'   See Plan of Care  '[]'   See progress note/recertification  '[]'   See Discharge Summary         Progress towards goals / Updated goals:  Short Term Goals:??To be accomplished in 3??weeks:  1. Pt will be independent and complaint w/ HEP to progress gains in PT.   At eval: reviewed and progressed HEP  Current: Met, HEP performance reported as prescribed, 07/11/2016  2. Pt will have AROM right knee to >??or = to 0-120 deg for normalized gait.   At eval: AROM right knee 7-91   Current: Progressing, Right Knee AROM 0-7-105 degrees, 07/11/2016  3. Pt will demo good quad set right w/o lag x10 SLR w/o increased pain for ease w/ curb navigation.   At  eval: fair to good quad set, noted fatigue after 3 SLR w/minimal lag present  Long Term Goals:??To be accomplished in 8??weeks:  1. Pt will improve FOTO to >??or = to 60 to demo improved function.   At eval: FOTO = 48  2. Pt will improve MMT right hip and knee to >??or = to 4+-5/5 w/o increased pain for ease w/ (I) community ambulation.   At eval: MMT right hip flex 3+/5 w/ increased pain, abd -4/5, ext 3+/5, knee flex 4/5, ext 4/5  3. Pt will improve balance as demo'd by SLS right for >??or = to 20 seconds w/o HHA for ease w/ stair navigation.   At eval: Requires HHA for SLS > 4 seconds 2' pain   4. Pt will demo correct ambulation w/o AD for >??or = to 579f for ease w/ community navigation.   At eval: Pt presents w/ antalgic gait and SPC left hand    PLAN  '[x]'   Upgrade activities as tolerated     '[x]'   Continue plan of care  '[x]'   Update interventions per flow sheet       '[]'   Discharge due to:_  '[]'   Other:_      SStephanie Coup PT 07/15/2016  2:34 PM    Future Appointments  Date Time Provider [place]  07/17/2016 11:30 AM [facility] Dies [facility]  07/19/2016 2:30 PM [facility] How [facility]  07/23/2016 11:30 AM [place] 1 [facility] [place]  07/26/2016 8:30 [facility]  07/29/2016 8:30 AM [facility]  07/30/2016 11:30 AM [facility]  08/02/2016 11:00 AM [facility] How PT [facility]  08/06/2016 9:00 AM [facility] Dies [facility]  08/08/2016 8:30 AM [facility] Dies PT [facility]  08/09/2016 10:30 AM [facility]ette How PT [facility]  08/13/2016 9:00 AM [facility] Dies [facility]  08/15/2016 8:00 AM [facility]  08/16/2016 9:00 AM [facility] How PT [facility]  08/19/2016 8:00 AM [facility] Dies PT [facility]  08/21/2016 9:00 AM [facility]annette How PT [facility]  08/23/2016 8:00 AM [facility] Dies PT [facility]  08/26/2016 8:00 AM [facility] Dies PT [facility]  08/28/2016 9:00 AM [facility] How [facility]  08/30/2016 8:00 AM [facility] Dies PT [facility]

## 2016-07-17 ENCOUNTER — Inpatient Hospital Stay: Admit: 2016-07-17 | Payer: MEDICARE | Primary: Family Medicine

## 2016-07-17 DIAGNOSIS — M25561 Pain in right knee: Secondary | ICD-10-CM | POA: Diagnosis not present

## 2016-07-17 NOTE — Progress Notes (Signed)
PT DAILY TREATMENT NOTE - MCR 12-16    Patient Name: Jason Nixon  Date:07/17/2016  DOB: 1940/03/10  '[x]'   Patient DOB Verified  Payor: [place] MEDICARE / Plan: [place] MEDICARE PART A & B / Product Type: Medicare /    In time:1125  Out time:1220  Total Treatment Time (min): 55  Total Timed Codes (min): 45  1:1 Treatment Time ([place] only): 40   Visit #: 5 of 24    Treatment Area: Pain in right knee [M25.561]    SUBJECTIVE  Pain Level (0-10 scale): 3  Any medication changes, allergies to medications, adverse drug reactions, diagnosis change, or new procedure performed?: '[x]'  No    '[]'  Yes (see summary sheet for update)  Subjective functional status/changes:   '[]'  No changes reported  Patient reports fall yesterday while ascending the stairs with patient prior to fall completing a lot of household chores. Patient reports landing on his knees with ability to get up independently. Patient reports no significant increase in swelling with patient reporting no increase in discomfort with weightbearing.     OBJECTIVE    Modality rationale: decrease inflammation and decrease pain to improve the patient???s ability to improve ease with sleep   Min Type Additional Details   10 '[x]'   Ice     '[]'   heat  '[]'   Ice massage Position: Reclined  Location: Right Knee, Post-Tx     20 min Therapeutic Exercise:  '[x]'  See flow sheet : Emphasis placed on improving available knee AROM and improving strength of the LE musculature   Rationale: increase ROM and increase strength to improve the patient???s ability to improve ease with household ambulation  ??  15 min Neuromuscular Re-education:  '[x]'   See flow sheet : Emphasis placed on improving activation and recruitment of the quadriceps and gluteal musculature and improving LE proprioceptive and kinesthetic awareness   Rationale: increase ROM, increase strength, improve balance and increase proprioception  to improve the patient???s ability to improve safety with community ADLs  ??  10 min Manual Therapy:     Supine, Right Femur AP Grade II-III Mobilization (terminal knee ext)  Supine, Right Hamstring Manual Stretch (90-90)  Supine, Right Hamstring Contract-Relax (90-90)  Supine, Right Knee Manually-Resisted TKE  Standing, Right Knee Manually-Resisted TKE (multi-directional)   Rationale: decrease pain, increase ROM and increase tissue extensibility to improve ease with stair management.        With   '[]'  TE   '[]'  TA   '[]'  neuro   '[]'  other: Patient Education: '[x]'  Review HEP    '[]'  Progressed/Changed HEP based on:   '[]'  positioning   '[]'  body mechanics   '[]'  transfers   '[]'  heat/ice application    '[]'  other:      Pain Level (0-10 scale) post treatment: 2    ASSESSMENT/Changes in Function: Assessed knee post-fall with patient without concerning symptoms present. Patient educated to continue to monitor knee and continue to ice to control swelling. With continued limitations in TKE objectively noted continued emphasis placed on promotion of TKE. Altered mechanics demonstrated with squatting with patient benefiting benefiting from verbal cuing.     Patient will continue to benefit from skilled PT services to modify and progress therapeutic interventions, address functional mobility deficits, address ROM deficits, address strength deficits, analyze and address soft tissue restrictions, analyze and cue movement patterns, analyze and modify body mechanics/ergonomics and assess and modify postural abnormalities to attain remaining goals.     '[]'   See Plan of Care  '[]'   See progress note/recertification  '[]'   See Discharge Summary         Progress towards goals / Updated goals:    Short Term Goals:??To be accomplished in 3??weeks:  1. Pt will be independent and complaint w/ HEP to progress gains in PT.   At eval: reviewed and progressed HEP  Current: Met, HEP performance reported as prescribed, 07/11/2016  2. Pt will have AROM right knee to >??or = to 0-120 deg for normalized gait.   At eval: AROM right knee 7-91    Current: Progressing, Right Knee AROM 0-6-110 degrees, 07/17/2016  3. Pt will demo good quad set right w/o lag x10 SLR w/o increased pain for ease w/ curb navigation.   At eval: fair to good quad set, noted fatigue after 3 SLR w/minimal lag present  Long Term Goals:??To be accomplished in 8??weeks:  1. Pt will improve FOTO to >??or = to 60 to demo improved function.   At eval: FOTO = 48  2. Pt will improve MMT right hip and knee to >??or = to 4+-5/5 w/o increased pain for ease w/ (I) community ambulation.   At eval: MMT right hip flex 3+/5 w/ increased pain, abd -4/5, ext 3+/5, knee flex 4/5, ext 4/5  3. Pt will improve balance as demo'd by SLS right for >??or = to 20 seconds w/o HHA for ease w/ stair navigation.   At eval: Requires HHA for SLS > 4 seconds 2' pain  Current: Progressing, Right SLS = 9 sec (no HHA), 07/17/2016  4. Pt will demo correct ambulation w/o AD for >??or = to 523f for ease w/ community navigation.   At eval: Pt presents w/ antalgic gait and SPC left hand    PLAN  '[x]'   Upgrade activities as tolerated     '[x]'   Continue plan of care  '[]'   Update interventions per flow sheet       '[]'   Discharge due to:_  '[]'   Other:_      VKara Dies PT 07/17/2016  9:53 AM    Future Appointments  Date Time Provider [place]  07/17/2016 11:30 AM [facility] Dies PT [facility]  07/19/2016 2:30 PM [facility]ette How PT [facility]  07/23/2016 11:30 AM MFred[place]PT [place] 1 [facility] [place]  07/24/2016 11:30 AM [place]PT [place] 1 [facility] [place]  07/26/2016 8:30 AM [facility] Dies PT [facility]  07/30/2016 11:30 AM [facility] How PT [facility]  07/31/2016 11:00 AM MBry[place]PT [place] 1 [facility] [place]  08/02/2016 11:00 AM [facility]  08/06/2016 9:00 AM [facility]  08/08/2016 8:30 AM [facility]  08/09/2016 10:30 AM [facility]ette How PT [facility]  08/13/2016 9:00 AM [facility]Kara Dies PT [facility]  08/15/2016 8:00 AM [facility]   08/16/2016 9:00 AM [facility] How PT [facility]  08/19/2016 8:00 AM [facility]  08/21/2016 9:00 AM [facility]ette How PT [facility]  08/23/2016 8:00 AM [facility] Dies PT [facility]  08/26/2016 8:00 AM [facility]  08/28/2016 9:00 AM JJeannette How PT [facility]  08/30/2016 8:00 AM [facility] Dies PT [facility]

## 2016-07-19 ENCOUNTER — Inpatient Hospital Stay
Admit: 2016-07-19 | Payer: MEDICARE | Attending: Rehabilitative and Restorative Service Providers" | Primary: Family Medicine

## 2016-07-19 DIAGNOSIS — M25561 Pain in right knee: Secondary | ICD-10-CM | POA: Diagnosis not present

## 2016-07-19 DIAGNOSIS — M1711 Unilateral primary osteoarthritis, right knee: Secondary | ICD-10-CM | POA: Diagnosis not present

## 2016-07-19 NOTE — Progress Notes (Signed)
PT DAILY TREATMENT NOTE - MCR 12-16    Patient Name: Jason Nixon  Date:07/19/2016  DOB: 1940-03-17  _0   Patient DOB Verified  Payor: [place] MEDICARE / Plan: [place] MEDICARE PART A & B / Product Type: Medicare /    In time:212  Out time:306  Total Treatment Time (min): 48  Total Timed Codes (min): 38  1:1 Treatment Time ([place] only): 30   Visit #: 6 of 24    Treatment Area: Pain in right knee [M25.561]    SUBJECTIVE  Pain Level (0-10 scale): 2/10  Any medication changes, allergies to medications, adverse drug reactions, diagnosis change, or new procedure performed?: _1  No    _2  Yes (see summary sheet for update)  Subjective functional status/changes:   _3  No changes reported  Pt reports MD is happy with his knee and he doesn't have to go back to the MD until August 10th     OBJECTIVE    Modality rationale: decrease edema, decrease inflammation and decrease pain to improve the patient???s ability to improve activity tolerance and mobility    Min Type Additional Details    _4  Estim:  _5 Unatt       _6 IFC  _7 Premod                        _8 Other:  _9 w/ice   _10 w/heat  Position:  Location:    _11  Estim: _12 Att    _13 TENS instruct  _14 NMES                    _15 Other:  _16 w/US   _17 w/ice   _18 w/heat  Position:  Location:    _19   Traction: _20  Cervical       _21 Lumbar                       _22  Prone          _23 Supine                       _24 Intermittent   _25 Continuous Lbs:  _26  before manual  _27  after manual    _28   Ultrasound: _29 Continuous   _30  Pulsed                           _31 1MHz   _32 3MHz W/cm2:  Location:    _33   Iontophoresis with dexamethasone         Location: _34  Take home patch   _35  In clinic   10 _36   Ice     _37   heat  _38   Ice massage  _39   Laser   _40   Anodyne Position: CP w/ elevation  Location: right knee    _41   Laser with stim  _42   Other:  Position:  Location:    _43   Vasopneumatic Device Pressure:       _44  lo _45  med _46  hi   Temperature: _47  lo _48  med _49  hi   _50  Skin assessment post-treatment:  _51 intact _52 redness- no adverse reaction     _53 redness ??? adverse reaction:      min _54 Eval                  _55 Re-Eval       20 min Therapeutic Exercise:  _56  See flow sheet : increased per flow sheet   Rationale: increase ROM, increase strength and improve coordination to improve the patient???s ability to  improve activity tolerance and gait      min Therapeutic Activity:  _0   See flow sheet :   Rationale:   to improve the patient???s ability to      8 min Neuromuscular Re-education:  _1   See flow sheet : balance and quad activation ex's per flow sheet   Rationale: increase strength, improve coordination, improve balance and increase proprioception  to improve the patient???s ability to decrease pain and improve activity tolerance     10 min Manual Therapy:  Patellar mobs, incision inspection, knee flexion mobs, TPR/DTM to vastus lateralis/ITB, popliteal release   Rationale: decrease pain, increase ROM, increase tissue extensibility and decrease edema  to improve gait and activity tolerance      min Gait Training:  ___ feet with ___ device on level surfaces with ___ level of assist   Rationale:          With   _2  TE   _3  TA   _4  neuro   _5  other: Patient Education: _6  Review HEP    _7  Progressed/Changed HEP based on:   _8  positioning   _9  body mechanics   _10  transfers   <LIDCVUDTHYHOOILN>_7<\/VJKQASUORVIFBPPH>_43  heat/ice application    <EXMDYJWLKHVFMBBU>_0<\/ZJQDUKRCVKFMMCRF>_54  other:      Other Objective/Functional Measures:      Pain Level (0-10 scale) post treatment: 2/10    ASSESSMENT/Changes in Function: Noted improved AROM w/ less pain today. Continue to progress as tolerated.     Patient will continue to benefit from skilled PT services to modify and progress therapeutic interventions, address functional mobility deficits, address ROM deficits, address strength deficits, analyze and address soft tissue restrictions, analyze and cue movement patterns, analyze and modify body mechanics/ergonomics, address imbalance/dizziness and instruct in home and community integration to attain remaining goals.     _13   See Plan of Care   _14   See progress note/recertification  <HKGOVPCHEKBTCYEL>_8<\/HTMBPJPETKKOECXF>_07   See Discharge Summary         Progress towards goals / Updated goals:  Short Term Goals:??To be accomplished in 3??weeks:  1. Pt will be independent and complaint w/ HEP to progress gains in PT.   At eval: reviewed and progressed HEP  Current: Met, HEP performance reported as prescribed, 07/11/2016  2. Pt will have AROM right knee to >??or = to 0-120 deg for normalized gait.   At eval: AROM right knee 7-91   Current: Progressing, Right Knee AROM 0-6-110 degrees, 07/17/2016  3. Pt will demo good quad set right w/o lag x10 SLR w/o increased pain for ease w/ curb navigation.   At eval: fair to good quad set, noted fatigue after 3 SLR w/minimal lag present  Long Term Goals:??To be accomplished in 8??weeks:  1. Pt will improve FOTO to >??or = to 60 to demo improved function.   At eval: FOTO = 48  Current: progressing, FOTO = 52 07/19/16  2. Pt will improve MMT right hip and knee to >??or = to 4+-5/5 w/o increased pain for ease w/ (I) community ambulation.   At eval: MMT right hip flex 3+/5 w/ increased pain, abd -4/5, ext 3+/5, knee flex 4/5, ext 4/5  3. Pt will improve balance as demo'd by SLS right for >??or = to 20 seconds w/o HHA for ease w/ stair navigation.   At eval: Requires HHA for SLS > 4 seconds 2' pain  Current: Progressing, Right SLS = 9 sec (no HHA), 07/17/2016  4. Pt will demo correct ambulation w/o AD for >??or = to 57f for  ease w/ community navigation.   At eval: Pt presents w/ antalgic gait and SPC left hand    PLAN  _0   Upgrade activities as tolerated     _1   Continue plan of care  _2   Update interventions per flow sheet       _3   Discharge due to:_  _4   Other:_      Jeannette How, PT 07/19/2016  2:15 PM    Future Appointments  Date Time Provider [place]   07/19/2016 2:30 PM Jeannette How, PT [facility]   07/23/2016 11:30 AM Angelique Blonder [facility]   07/24/2016 11:30 AM Hannah Leffew [facility] [place]   07/26/2016 8:30 AM Kara Dies, PT [facility]    07/30/2016 11:30 AM Jeannette How, PT [facility]   07/31/2016 11:00 AM Hannah Leffew [facility] [place]   08/02/2016 11:00 AM Hannah Leffew [facility] [place]   08/06/2016 9:00 AM Kara Dies, PT [facility]   08/08/2016 8:30 AM Kara Dies, PT [facility]   08/09/2016 10:30 AM Jeannette How, PT [facility]   08/13/2016 9:00 AM Kara Dies, PT [facility]   08/15/2016 8:00 AM Kara Dies, PT [facility]   08/16/2016 9:00 AM Jeannette How, PT [facility]   08/19/2016 8:00 AM Kara Dies, PT [facility]   08/21/2016 9:00 AM Jeannette How, PT [facility]   08/23/2016 8:00 AM Kara Dies, PT [facility]   08/26/2016 8:00 AM Kara Dies, PT [facility]   08/28/2016 9:00 AM Jeannette How, PT [facility]   08/30/2016 8:00 AM Kara Dies, PT [facility]

## 2016-07-23 ENCOUNTER — Inpatient Hospital Stay: Admit: 2016-07-23 | Payer: MEDICARE | Primary: Family Medicine

## 2016-07-23 DIAGNOSIS — M25561 Pain in right knee: Secondary | ICD-10-CM | POA: Diagnosis not present

## 2016-07-23 NOTE — Progress Notes (Signed)
PT DAILY TREATMENT NOTE - MCR 12-16    Patient Name: Jason Nixon  Date:07/23/2016  DOB: June 02, 1940  _0   Patient DOB Verified  Payor: [place] MEDICARE / Plan: [place] MEDICARE PART A & B / Product Type: Medicare /    In time:11:27 Out time: 12:20  Total Treatment Time (min): 53  Total Timed Codes (min): 43  1:1 Treatment Time ([place] only): 10  Visit #: 7 of 24    Treatment Area: Pain in right knee [M25.561]    SUBJECTIVE  Pain Level (0-10 scale): 1-2/10  Any medication changes, allergies to medications, adverse drug reactions, diagnosis change, or new procedure performed?: _1  No    _2  Yes (see summary sheet for update)  Subjective functional status/changes:   _3  No changes reported  Pt notes that his knee continues to feel better. Pt notes that he feels that his knee is less stiff and is able to move it with less pain.    OBJECTIVE    Modality rationale: decrease edema and decrease pain to improve the patient???s ability to improve activity tolerance and community ambulation.   Min Type Additional Details    _4  Estim:  _5 Unatt       _6 IFC  _7 Premod                        _8 Other:  _9 w/ice   _10 w/heat  Position:  Location:    _11  Estim: _12 Att    _13 TENS instruct  _14 NMES                    _15 Other:  _16 w/US   _17 w/ice   _18 w/heat  Position:  Location:    _19   Traction: _20  Cervical       _21 Lumbar                       _22  Prone          _23 Supine                       _24 Intermittent   _25 Continuous Lbs:  _26  before manual  _27  after manual    _28   Ultrasound: _29 Continuous   _30  Pulsed                           _31 1MHz   _32 3MHz W/cm2:  Location:    _33   Iontophoresis with dexamethasone         Location: _34  Take home patch   _35  In clinic   10 _36   Ice     _37   heat  _38   Ice massage  _39   Laser   _40   Anodyne Position:sitting  Location:right knee    _41   Laser with stim  _42   Other:  Position:  Location:    _43   Vasopneumatic Device Pressure:       _44  lo _45  med _46  hi   Temperature: _47  lo _48  med _49  hi          23 min Therapeutic Exercise:  _50  See flow sheet :   Rationale: increase ROM, increase strength and improve coordination to improve the patient???s ability to improve activity tolerance       10 min Neuromuscular Re-education:  _51  See flow sheet : Pt instructed in balance and quad activation exercises.    Rationale: increase strength, improve coordination and increase proprioception  to improve the patient???s ability to increase  activity tolerance    10 min Manual Therapy:  Patellar mobilization, STM to vastus lateralis/ITB and distal HS attachment. Scar mobilization.   Rationale: decrease pain, increase ROM, increase tissue extensibility and decrease trigger points to improve gait mechanics           With   _0  TE   _1  TA   _2  neuro   _3  other: Patient Education: _4  Review HEP    _5  Progressed/Changed HEP based on:   _6  positioning   _7  body mechanics   _8  transfers   <ZYSAYTKZSWFUXNAT>_5<\/TDDUKGURKYHCWCBJ>_6  heat/ice application    <EGBTDVVOHYWVPXTG>_6<\/YIRSWNIOEVOJJKKX>_38  other:           Pain Level (0-10 scale) post treatment: 1/10  ASSESSMENT/Changes in Function:   Noted decreased stiffness following STM and demonstrated with increased AROM of right knee post treatment.    Patient will continue to benefit from skilled PT services to address functional mobility deficits, address ROM deficits, address strength deficits, analyze and address soft tissue restrictions and assess and modify postural abnormalities to attain remaining goals.     _11   See Plan of Care  _12   See progress note/recertification  <HWEXHBZJIRCVELFY>_1<\/OFBPZWCHENIDPOEU>_23   See Discharge Summary           Progress towards goals / Updated goals:  Short Term Goals:??To be accomplished in 3??weeks:  1. Pt will be independent and complaint w/ HEP to progress gains in PT.   At eval: reviewed and progressed HEP  Current: Met, HEP performance reported as prescribed, 07/11/2016  2. Pt will have AROM right knee to >??or = to 0-120 deg for normalized gait.   At eval: AROM right knee 7-91   Current: AROM right knee 0-115   Current: Progressing, Right Knee AROM 0-6-110??degrees, 07/17/2016  3. Pt will demo good quad set right w/o lag x10 SLR w/o increased pain for ease w/ curb navigation.   At eval: fair to good quad set, noted fatigue after 3 SLR w/minimal lag present  Long Term Goals:??To be accomplished in 8??weeks:  1. Pt will improve FOTO to >??or = to 60 to demo improved function.   At eval: FOTO = 48  Current: progressing, FOTO = 52 07/19/16  2. Pt will improve MMT right hip and knee to >??or = to 4+-5/5 w/o increased pain for ease w/ (I) community ambulation.   At eval: MMT right hip flex 3+/5 w/ increased pain, abd -4/5, ext 3+/5, knee flex 4/5, ext 4/5  3. Pt will improve balance as demo'd by SLS right for >??or = to 20 seconds w/o HHA for ease w/ stair navigation.   At eval: Requires HHA for SLS > 4 seconds 2' pain  Current: Progressing, Right SLS = 9 sec (no HHA), 07/17/2016  4. Pt will demo correct ambulation w/o AD for >??or = to 529f for ease w/ community navigation.   At eval: Pt presents w/ antalgic gait and SPC left hand    PLAN  _14   Upgrade activities as tolerated     _15   Continue plan of care  _16   Update interventions per flow sheet       _17   Discharge due to:_  _18   Other:_      HAngelique Blonder6/01/2017  2:49 PM    Future Appointments  Date Time Provider [place]  07/24/2016 11:30 AM [facility]  07/26/2016 8:30 AM [facility] Dies [facility]  07/30/2016 11:30 AM [facility]ette [facility]  07/31/2016 11:00  AM Angelique Blonder [facility]   08/02/2016 11:00 AM Romello Hoehn [facility] [place]   08/06/2016 9:00 AM Kara Dies, PT [facility]   08/08/2016 8:30 AM Kara Dies, PT [facility]   08/09/2016 10:30 AM Jeannette How, PT [facility]   08/13/2016 9:00 AM Kara Dies, PT [facility]   08/15/2016 8:00 AM Kara Dies, PT [facility]   08/16/2016 9:00 AM Jeannette How, PT [facility]   08/19/2016 8:00 AM Kara Dies, PT [facility]   08/21/2016 9:00 AM Jeannette How, PT [facility]    08/23/2016 8:00 AM Kara Dies, PT [facility]   08/26/2016 8:00 AM Kara Dies, PT [facility]   08/28/2016 9:00 AM Jeannette How, PT [facility]   08/30/2016 8:00 AM Kara Dies, PT [facility]

## 2016-07-24 ENCOUNTER — Inpatient Hospital Stay: Admit: 2016-07-24 | Payer: MEDICARE | Primary: Family Medicine

## 2016-07-24 DIAGNOSIS — M25561 Pain in right knee: Secondary | ICD-10-CM | POA: Diagnosis not present

## 2016-07-24 NOTE — Progress Notes (Addendum)
PT DAILY TREATMENT NOTE - MCR 12-16    Patient Name: Jason Nixon  Date:07/24/2016  DOB: November 02, 1940  _0   Patient DOB Verified  Payor: [place] MEDICARE / Plan: VA MEDICARE PART A & B / Product Type: Medicare /    In time:11:27  Out time:12:33  Total Treatment Time (min): 66  Total Timed Codes (min): 56  1:1 Treatment Time ([place] only): 24  Visit #: 8 of 24    Treatment Area: Pain in right knee [M25.561]    SUBJECTIVE  Pain Level (0-10 scale): 0/10  Any medication changes, allergies to medications, adverse drug reactions, diagnosis change, or new procedure performed?: _1  No    _2  Yes (see summary sheet for update)  Subjective functional status/changes:   _3  No changes reported  Pt has no new complaints at this time. Denies any pain.    OBJECTIVE    Modality rationale: decrease edema, decrease inflammation and decrease pain  to increase activity tolerance   Min Type Additional Details    _4  Estim:  _5 Unatt       _6 IFC  _7 Premod                        _8 Other:  _9 w/ice   _10 w/heat  Position:  Location:    _11  Estim: _12 Att    _13 TENS instruct  _14 NMES                    _15 Other:  _16 w/US   _17 w/ice   _18 w/heat  Position:  Location:    _19   Traction: _20  Cervical       _21 Lumbar                       _22  Prone          _23 Supine                       _24 Intermittent   _25 Continuous Lbs:  _26  before manual  _27  after manual    _28   Ultrasound: _29 Continuous   _30  Pulsed                           _31 1MHz   _32 3MHz W/cm2:  Location:    _33   Iontophoresis with dexamethasone         Location: _34  Take home patch   _35  In clinic   10 _36   Ice     _37   heat  _38   Ice massage  _39   Laser   _40   Anodyne Position: long sitting   Location: Right knee    _41   Laser with stim  _42   Other:  Position:  Location:    _43   Vasopneumatic Device Pressure:       _44  lo _45  med _46  hi   Temperature: _47  lo _48  med _49  hi   _50  Skin assessment post-treatment:  _51 intact _52 redness- no adverse reaction    _53 redness ??? adverse reaction:        38 min Therapeutic Exercise:  _54  See flow sheet :   Rationale: increase ROM and increase strength to improve the patient???s ability to increase activity tolerance    8 min Neuromuscular Re-education:  _55   See flow sheet : Emphasis on quad activation and control during step downs and step ups. Single leg balance activities to improve static balance.   Rationale: improve coordination, improve balance and increase proprioception  to improve  the patient???s ability to improve ease     10 min Manual Therapy:  Right ITB and vastus lateralis, patella joint mobs, scar mobility    Rationale: decrease pain, increase ROM, increase tissue extensibility and decrease trigger points to increase ease with ambulation          With   _0  TE   _1  TA   _2  neuro   _3  other: Patient Education: _4  Review HEP    _5  Progressed/Changed HEP based on:   _6  positioning   _7  body mechanics   _8  transfers   <KDTOIZTIWPYKDXIP>_3<\/ASNKNLZJQBHALPFX>_9  heat/ice application    <KWIOXBDZHGDJMEQA>_8<\/TMHDQQIWLNLGXQJJ>_94  other:           Pain Level (0-10 scale) post treatment: 0/10 ( just stiff after ice)    ASSESSMENT/Changes in Function:     Pt is continuing to progress well evident by less cueing for proper technique, increased AROM and increased activity tolerance.    Patient will continue to benefit from skilled PT services to address functional mobility deficits, address ROM deficits, address strength deficits, analyze and address soft tissue restrictions and analyze and cue movement patterns to attain remaining goals.     _11   See Plan of Care  _12   See progress note/recertification  <RDEYCXKGYJEHUDJS>_9<\/FWYOVZCHYIFOYDXA>_12   See Discharge Summary         Progress towards goals / Updated goals:  Progress towards goals / Updated goals:  Short Term Goals:??To be accomplished in 3??weeks:  1. Pt will be independent and complaint w/ HEP to progress gains in PT.   At eval: reviewed and progressed HEP  Current: Met, HEP performance reported as prescribed, 07/11/2016  2. Pt will have AROM right knee to >??or = to 0-120 deg for normalized gait.    At eval: AROM right knee 7-91   Current: AROM right knee 0-115, 07/24/16  Current: Progressing, Right Knee AROM 0-6-110??degrees, 07/17/2016  3. Pt will demo good quad set right w/o lag x10 SLR w/o increased pain for ease w/ curb navigation.   At eval: fair to good quad set, noted fatigue after 3 SLR w/minimal lag present  Long Term Goals:??To be accomplished in 8??weeks:  1. Pt will improve FOTO to >??or = to 60 to demo improved function.   At eval: FOTO = 48  Current: progressing, FOTO = 52??07/19/16  2. Pt will improve MMT right hip and knee to >??or = to 4+-5/5 w/o increased pain for ease w/ (I) community ambulation.   At eval: MMT right hip flex 3+/5 w/ increased pain, abd -4/5, ext 3+/5, knee flex 4/5, ext 4/5  3. Pt will improve balance as demo'd by SLS right for >??or = to 20 seconds w/o HHA for ease w/ stair navigation.   At eval: Requires HHA for SLS > 4 seconds 2' pain  Current: Progressing, Right SLS = 9 sec (no HHA), 07/17/2016  4. Pt will demo correct ambulation w/o AD for >??or = to 537f for ease w/ community navigation.   At eval: Pt presents w/ antalgic gait and SPC left hand    PLAN  _14   Upgrade activities as tolerated     _15   Continue plan of care  _16   Update interventions per flow sheet       _17   Discharge due to:_  _18   Other:_      HAngelique Blonder6/13/2018  12:05 PM    Future Appointments  Date Time Provider [place]  07/26/2016 8:30 AM [facility] Dies PT [facility]  07/30/2016 11:30 AM Jeannette How, PT [facility]   07/31/2016 11:00 AM Ronnika Collett [facility] [place]   08/02/2016 11:00 AM Layton Tappan [facility] [place]   08/06/2016 9:00 AM Kara Dies, PT [facility]   08/08/2016 8:30 AM Kara Dies, PT [facility]   08/09/2016 10:30 AM Jeannette How, PT [facility]   08/13/2016 9:00 AM Kara Dies, PT [facility]   08/15/2016 8:00 AM Kara Dies, PT [facility]   08/16/2016 9:00 AM Jeannette How, PT [facility]   08/19/2016 8:00 AM Kara Dies, PT [facility]    08/21/2016 9:00 AM Jeannette How, PT [facility]   08/23/2016 8:00 AM Kara Dies, PT [facility]   08/26/2016 8:00 AM Kara Dies, PT [facility]   08/28/2016 9:00 AM Jeannette How, PT [facility]   08/30/2016 8:00 AM Kara Dies, PT [facility]

## 2016-07-26 ENCOUNTER — Inpatient Hospital Stay: Admit: 2016-07-26 | Payer: MEDICARE | Primary: Family Medicine

## 2016-07-26 DIAGNOSIS — M25561 Pain in right knee: Secondary | ICD-10-CM | POA: Diagnosis not present

## 2016-07-26 NOTE — Progress Notes (Signed)
PT DAILY TREATMENT NOTE - MCR 12-16    Patient Name: Jason Nixon  Date:07/26/2016  DOB: [birth year]  [x]  Patient DOB Verified  Payor: VA MEDICARE / Plan: VA MEDICARE PART A & B / Product Type: Medicare /    In QAST:4196  Out time:0917  Total Treatment Time (min): 56  Total Timed Codes (min): 46  1:1 Treatment Time ([place] only): 15   Visit #: 9 of 24    Treatment Area: Pain in right knee [M25.561]    SUBJECTIVE  Pain Level (0-10 scale): 1  Any medication changes, allergies to medications, adverse drug reactions, diagnosis change, or new procedure performed?: [x] No    [] Yes (see summary sheet for update)  Subjective functional status/changes:   [] No changes reported  Patient reports ability to mow the lawn yesterday    OBJECTIVE    Modality rationale: decrease inflammation and decrease pain to improve the patient???s ability to improve ease with sleep   Min Type Additional Details   10 [x]  Ice     []  heat  []  Ice massage Position: Reclined  Location: Right Knee, Post-Tx     20 min Therapeutic Exercise: ??[x]??See flow sheet :??Emphasis placed on improving available knee AROM and improving strength of the LE musculature   Rationale: increase ROM and increase strength??to improve the patient???s ability to improve ease with household ambulation  ????  18 min Neuromuscular Re-education: ??[x]????See flow sheet :??Emphasis placed on improving activation and recruitment of the quadriceps and gluteal musculature and improving LE proprioceptive and kinesthetic awareness   Rationale:??increase ROM, increase strength, improve balance and increase proprioception????to improve the patient???s ability to improve safety with community ADLs  ????  8 min Manual Therapy: ??  Prone, Right Hip PA Grade III-IV Mobilization (w/ quadriceps manual stretch)  Prone, Right Hip PA Grade III-IV Mobilization (FABER position)  Supine, Right Femur AP Grade II-III Mobilization (terminal knee ext)  Supine, Right Knee Manually-Resisted TKE    Rationale:??decrease pain, increase ROM and increase tissue extensibility??to improve ease with stair management.                                                                           With   [] TE   [] TA   [] neuro   [] other: Patient Education: [x] Review HEP    [] Progressed/Changed HEP based on:   [] positioning   [] body mechanics   [] transfers   [] heat/ice application    [] other:      Other Objective/Functional Measures:     Pre-Manual Knee AROM: 0 - 5 - 114  Post-Manual Knee AROM: 0 - 3 - 115     Pain Level (0-10 scale) post treatment: 0    ASSESSMENT/Changes in Function: Initiated right hip PA mobilizations secondary to poor capsular mobility demonstrated questioning contribution to continued limitations in right knee AROM. Initiated supine bridges in order to improve gluteal activation with diminished hip extension during terminal stance phase of gait demonstrated. Diminished eccentric control noted with completion of step downs.    Patient will continue to benefit from skilled PT services to modify and progress therapeutic interventions, address functional mobility deficits, address  ROM deficits, address strength deficits, analyze and address soft tissue restrictions, analyze and cue movement patterns and analyze and modify body mechanics/ergonomics to attain remaining goals.     []  See Plan of Care  []  See progress note/recertification  []  See Discharge Summary         Progress towards goals / Updated goals:    Short Term Goals:??To be accomplished in 3??weeks:  1. Pt will be independent and complaint w/ HEP to progress gains in PT.   At eval: reviewed and progressed HEP  Current: Met, HEP performance reported as prescribed, 07/11/2016  2. Pt will have AROM right knee to >??or = to 0-120 deg for normalized gait.   At eval: AROM right knee 7-91   Current: Progressing, AROM Right knee 0-115, 07/24/16  3. Pt will demo good quad set right w/o lag x10 SLR w/o increased pain for ease w/ curb navigation.    At eval: fair to good quad set, noted fatigue after 3 SLR w/minimal lag present  Long Term Goals:??To be accomplished in 8??weeks:  1. Pt will improve FOTO to >??or = to 60 to demo improved function.   At eval: FOTO = 48  Current: Progressing, FOTO = 52??07/19/16  2. Pt will improve MMT right hip and knee to >??or = to 4+-5/5 w/o increased pain for ease w/ (I) community ambulation.   At eval: MMT right hip flex 3+/5 w/ increased pain, abd -4/5, ext 3+/5, knee flex 4/5, ext 4/5  3. Pt will improve balance as demo'd by SLS right for >??or = to 20 seconds w/o HHA for ease w/ stair navigation.   At eval: Requires HHA for SLS > 4 seconds 2' pain  Current: Progressing, Right SLS = 9 sec (no HHA), 07/17/2016  4. Pt will demo correct ambulation w/o AD for >??or = to 575f for ease w/ community navigation.   At eval: Pt presents w/ antalgic gait and SPC left hand   Current: Progressing, Right antalgic gait pattern with decreased right knee flexion during swing phase and decreased right hip extension during push-off with gait without AD, 07/26/2016    PLAN  [x]  Upgrade activities as tolerated     [x]  Continue plan of care  []  Update interventions per flow sheet       []  Discharge due to:_  []  Other:_      VKara Dies PT 07/26/2016  6:53 AM    Future Appointments  Date Time Provider DV[place]  07/26/2016 8:30 AM [facility]  07/30/2016 11:30 AM [facility]annette How PT [facility]  07/31/2016 11:00 AM Hannah Leffew [facility]  08/02/2016 11:00 AM [place] Leffew [facility] [place]  08/06/2016 9:00 AM [facility] Dies PT [facility]  08/08/2016 8:30 AM [facility]  08/09/2016 10:30 AM [facility]annette How PT [facility]  08/13/2016 9:00 AM [facility]  08/15/2016 8:00 AM [facility] Dies PT [facility]  08/16/2016 9:00 AM [facility]ette How PT [facility]  08/19/2016 8:00 AM [facility] Dies [facility]  08/21/2016 9:00 AM [facility]   08/23/2016 8:00 AM [facility] Dies [facility]  08/26/2016 8:00 AM [facility] Dies PT [facility]  08/28/2016 9:00 AM [facility]  08/30/2016 8:00  AM Kara Dies, PT [facility]

## 2016-07-29 ENCOUNTER — Encounter: Payer: MEDICARE | Primary: Family Medicine

## 2016-07-30 ENCOUNTER — Inpatient Hospital Stay
Admit: 2016-07-30 | Payer: MEDICARE | Attending: Rehabilitative and Restorative Service Providers" | Primary: Family Medicine

## 2016-07-30 DIAGNOSIS — M25561 Pain in right knee: Secondary | ICD-10-CM | POA: Diagnosis not present

## 2016-07-30 NOTE — Progress Notes (Signed)
PT DAILY TREATMENT NOTE - MCR 12-16    Patient Name: Jason Nixon  Date:07/30/2016  DOB: 13-Apr-1940  '[x]'   Patient DOB Verified  Payor: [place] MEDICARE / Plan: [place] MEDICARE PART A & B / Product Type: Medicare /    In time:1128  Out time:1208  Total Treatment Time (min): 40  Total Timed Codes (min): 40  1:1 Treatment Time ([facility] only): 39   Visit #: 10 of 24    Treatment Area: Pain in right knee [M25.561]    SUBJECTIVE  Pain Level (0-10 scale): 0/10  Any medication changes, allergies to medications, adverse drug reactions, diagnosis change, or new procedure performed?: '[x]'  No    '[]'  Yes (see summary sheet for update)  Subjective functional status/changes:   '[]'  No changes reported  Pt reports he feels pretty good now    OBJECTIVE    Modality rationale:    Min Type Additional Details    '[]'  Estim:  '[]' Unatt       '[]' IFC  '[]' Premod                        '[]' Other:  '[]' w/ice   '[]' w/heat  Position:  Location:    '[]'  Estim: '[]' Att    '[]' TENS instruct  '[]' NMES                    '[]' Other:  '[]' w/US   '[]' w/ice   '[]' w/heat  Position:  Location:    '[]'   Traction: '[]'  Cervical       '[]' Lumbar                       '[]'  Prone          '[]' Supine                       '[]' Intermittent   '[]' Continuous Lbs:  '[]'  before manual  '[]'  after manual    '[]'   Ultrasound: '[]' Continuous   '[]'  Pulsed                           '[]' 1MHz   '[]' 3MHz W/cm2:  Location:    '[]'   Iontophoresis with dexamethasone         Location: '[]'  Take home patch   '[]'  In clinic    '[]'   Ice     '[]'   heat  '[]'   Ice massage  '[]'   Laser   '[]'   Anodyne Position:   Location:    '[]'   Laser with stim  '[]'   Other:  Position:  Location:    '[]'   Vasopneumatic Device Pressure:       '[]'  lo '[]'  med '[]'  hi   Temperature: '[]'  lo '[]'  med '[]'  hi   '[]'  Skin assessment post-treatment:  '[]' intact '[]' redness- no adverse reaction    '[]' redness ??? adverse reaction:      min '[]' Eval                  '[]' Re-Eval       20 min Therapeutic Exercise:  '[x]'  See flow sheet : increased per flow sheet    Rationale: increase ROM, increase strength and improve coordination to improve the patient???s ability to improve gait and ADLs     min Therapeutic Activity:  '[]'   See flow sheet :   Rationale:   to improve the patient???s ability to      10 min Neuromuscular  Re-education:  '[x]'   See flow sheet : quad activation and balance ex's per flow sheet   Rationale: increase strength, improve coordination, improve balance and increase proprioception  to improve the patient???s ability to normalize gait and improve activity tolerance     10  min Manual Therapy:  Patellar mobs, incision inspection, knee flexion mobs, TPR/DTM to vastus lateralis/ITB, popliteal release   Rationale: decrease pain, increase ROM, increase tissue extensibility and decrease edema  to improve mobility and gait     min Gait Training:  ___ feet with ___ device on level surfaces with ___ level of assist   Rationale:          With   '[]'  TE   '[]'  TA   '[]'  neuro   '[]'  other: Patient Education: '[x]'  Review HEP    '[]'  Progressed/Changed HEP based on:   '[]'  positioning   '[]'  body mechanics   '[]'  transfers   '[]'  heat/ice application    '[]'  other:      Other Objective/Functional Measures:      Pain Level (0-10 scale) post treatment: 0/10    ASSESSMENT/Changes in Function: PD CP today stating he will complete at home. Continue to progress as tolerated.     Patient will continue to benefit from skilled PT services to modify and progress therapeutic interventions, address functional mobility deficits, address ROM deficits, address strength deficits, analyze and address soft tissue restrictions, analyze and cue movement patterns, analyze and modify body mechanics/ergonomics, address imbalance/dizziness and instruct in home and community integration to attain remaining goals.     '[]'   See Plan of Care  '[]'   See progress note/recertification  '[]'   See Discharge Summary         Progress towards goals / Updated goals:  Short Term Goals:??To be accomplished in 3??weeks:   1. Pt will be independent and complaint w/ HEP to progress gains in PT.   At eval: reviewed and progressed HEP  Current: Met, HEP performance reported as prescribed, 07/11/2016  2. Pt will have AROM right knee to >??or = to 0-120 deg for normalized gait.   At eval: AROM right knee 7-91   Current: Progressing, AROM Right knee 0-115, 07/24/16  3. Pt will demo good quad set right w/o lag x10 SLR w/o increased pain for ease w/ curb navigation.   At eval: fair to good quad set, noted fatigue after 3 SLR w/minimal lag present  Current: progressing, fair to good quad set noted, mild lag noted after 8 SLR's w/3# wt 07/30/16  Long Term Goals:??To be accomplished in 8??weeks:  1. Pt will improve FOTO to >??or = to 60 to demo improved function.   At eval: FOTO = 48  Current: Progressing, FOTO = 52??07/19/16  2. Pt will improve MMT right hip and knee to >??or = to 4+-5/5 w/o increased pain for ease w/ (I) community ambulation.   At eval: MMT right hip flex 3+/5 w/ increased pain, abd -4/5, ext 3+/5, knee flex 4/5, ext 4/5  3. Pt will improve balance as demo'd by SLS right for >??or = to 20 seconds w/o HHA for ease w/ stair navigation.   At eval: Requires HHA for SLS > 4 seconds 2' pain  Current: Progressing, Right SLS = 9 sec (no HHA), 07/17/2016  4. Pt will demo correct ambulation w/o AD for >??or = to 5106f for ease w/ community navigation.   At eval: Pt presents w/ antalgic gait and SPC left hand   Current: Progressing, Right  antalgic gait pattern with decreased right knee flexion during swing phase and decreased right hip extension during push-off with gait without AD, 07/26/2016    PLAN  '[x]'   Upgrade activities as tolerated     '[x]'   Continue plan of care  '[]'   Update interventions per flow sheet       '[]'   Discharge due to:_  '[]'   Other:_      Jeannette How, PT 07/30/2016  8:16 AM    Future Appointments  Date Time Provider [place]   07/30/2016 11:30 AM Jeannette How, PT [facility]   07/31/2016 11:00 AM Hannah Leffew [facility]    08/02/2016 11:00 AM Hannah Leffew [facility] [place]   08/06/2016 9:00 AM Kara Dies, PT [facility]   08/08/2016 8:30 AM Kara Dies, PT [facility]   08/09/2016 10:30 AM Jeannette How, PT [facility]   08/13/2016 9:00 AM Kara Dies, PT [facility]   08/15/2016 8:00 AM Kara Dies, PT [facility]   08/16/2016 9:00 AM Jeannette How, PT [facility]   08/19/2016 8:00 AM Kara Dies, PT [facility]   08/21/2016 9:00 AM Jeannette How, PT [facility]   08/23/2016 8:00 AM Kara Dies, PT [facility]   08/26/2016 8:00 AM Kara Dies, PT [facility]   08/28/2016 9:00 AM Jeannette How, PT [facility]   08/30/2016 8:00 AM Kara Dies, PT [facility]

## 2016-07-31 ENCOUNTER — Inpatient Hospital Stay: Admit: 2016-07-31 | Payer: MEDICARE | Primary: Family Medicine

## 2016-07-31 DIAGNOSIS — M25561 Pain in right knee: Secondary | ICD-10-CM | POA: Diagnosis not present

## 2016-07-31 NOTE — Progress Notes (Signed)
PT DAILY TREATMENT NOTE - MCR 12-16    Patient Name: Jason Nixon  Date:07/31/2016  DOB: Apr 30, 1940  '[x]'   Patient DOB Verified  Payor: [place] MEDICARE / Plan: VA MEDICARE PART A & B / Product Type: Medicare /    In time:10:55  Out time: 11:55  Total Treatment Time (min): 60  Total Timed Codes (min): 50  1:1 Treatment Time ([place] only): 30  Visit #: 11 of 24    Treatment Area: Pain in right knee [M25.561]    SUBJECTIVE  Pain Level (0-10 scale): 2/10  Any medication changes, allergies to medications, adverse drug reactions, diagnosis change, or new procedure performed?: '[x]'  No    '[]'  Yes (see summary sheet for update)  Subjective functional status/changes:   '[]'  No changes reported  Pt notes that his knee is feeling good today. Reports mild soreness.     OBJECTIVE    Modality rationale: decrease pain to improve the patient???s ability to improve ease with ambulation   Min Type Additional Details    '[]'  Estim:  '[]' Unatt       '[]' IFC  '[]' Premod                        '[]' Other:  '[]' w/ice   '[]' w/heat  Position:  Location:    '[]'  Estim: '[]' Att    '[]' TENS instruct  '[]' NMES                    '[]' Other:  '[]' w/US   '[]' w/ice   '[]' w/heat  Position:  Location:    '[]'   Traction: '[]'  Cervical       '[]' Lumbar                       '[]'  Prone          '[]' Supine                       '[]' Intermittent   '[]' Continuous Lbs:  '[]'  before manual  '[]'  after manual    '[]'   Ultrasound: '[]' Continuous   '[]'  Pulsed                           '[]' 1MHz   '[]' 3MHz W/cm2:  Location:    '[]'   Iontophoresis with dexamethasone         Location: '[]'  Take home patch   '[]'  In clinic   10 '[x]'   Ice     '[]'   heat  '[]'   Ice massage  '[]'   Laser   '[]'   Anodyne Position:reclined  Location: Right knee    '[]'   Laser with stim  '[]'   Other:  Position:  Location:    '[]'   Vasopneumatic Device Pressure:       '[]'  lo '[]'  med '[]'  hi   Temperature: '[]'  lo '[]'  med '[]'  hi   '[]'  Skin assessment post-treatment:  '[]' intact '[]' redness- no adverse reaction    '[]' redness ??? adverse reaction:        30 min Therapeutic Exercise:  '[x]'  See flow sheet :   Rationale: increase ROM, increase strength and improve coordination to improve the patient???s ability to tolerated functional activiites    22 min Neuromuscular Re-education:  '[x]'   See flow sheet : Quad activation and standing balance activities   Rationale: improve coordination, improve balance and increase proprioception  to improve the patient???s ability to improve ease with ambulation on uneven surfaces and  stair navigation    8 min Manual Therapy:  Patellar joint mobs, STM to hamstring, gastroc and quad musculature    Rationale: decrease pain, increase ROM, increase tissue extensibility and decrease trigger points to improve gait mechanics.          With   '[x]'  TE   '[]'  TA   '[]'  neuro   '[]'  other: Patient Education: '[x]'  Review HEP    '[]'  Progressed/Changed HEP based on:   '[]'  positioning   '[]'  body mechanics   '[]'  transfers   '[]'  heat/ice application    '[]'  other:           Pain Level (0-10 scale) post treatment: 1/10    ASSESSMENT/Changes in Function:     Pt tolerated today's treatment well. Notes decreased soreness following today's session. Requires occasional cues for proper technique .  Patient will continue to benefit from skilled PT services to address functional mobility deficits, address ROM deficits, address strength deficits and analyze and address soft tissue restrictions to attain remaining goals.     '[x]'   See Plan of Care  '[]'   See progress note/recertification  '[]'   See Discharge Summary         Progress towards goals / Updated goals:  Short Term Goals:??To be accomplished in 3??weeks:  1. Pt will be independent and complaint w/ HEP to progress gains in PT.   At eval: reviewed and progressed HEP  Current: Met, HEP performance reported as prescribed, 07/11/2016  2. Pt will have AROM right knee to >??or = to 0-120 deg for normalized gait.   At eval: AROM right knee 7-91   Current: Progressing,??AROM Right knee 0-115, 07/24/16   Current: progresing, AROM right knee 0-116, 07/31/16  3. Pt will demo good quad set right w/o lag x10 SLR w/o increased pain for ease w/ curb navigation.   At eval: fair to good quad set, noted fatigue after 3 SLR w/minimal lag present  Current: progressing, fair to good quad set noted, mild lag noted after 8 SLR's w/3# wt 07/30/16  Long Term Goals:??To be accomplished in 8??weeks:  1. Pt will improve FOTO to >??or = to 60 to demo improved function.   At eval: FOTO = 48  Current: Progressing, FOTO = 52??07/19/16  2. Pt will improve MMT right hip and knee to >??or = to 4+-5/5 w/o increased pain for ease w/ (I) community ambulation.   At eval: MMT right hip flex 3+/5 w/ increased pain, abd -4/5, ext 3+/5, knee flex 4/5, ext 4/5  3. Pt will improve balance as demo'd by SLS right for >??or = to 20 seconds w/o HHA for ease w/ stair navigation.   At eval: Requires HHA for SLS > 4 seconds 2' pain  Current: Progressing, Right SLS = 9 sec (no HHA), 07/17/2016  4. Pt will demo correct ambulation w/o AD for >??or = to 557f for ease w/ community navigation.   At eval: Pt presents w/ antalgic gait and SPC left hand??  Current: Progressing, Right antalgic gait pattern with decreased right knee flexion during swing phase and decreased right hip extension during push-off with gait without AD, 07/26/2016    PLAN  '[x]'   Upgrade activities as tolerated     '[x]'   Continue plan of care  '[]'   Update interventions per flow sheet       '[]'   Discharge due to:_  '[]'   Other:_      HAngelique Blonder6/20/2018  10:56 AM    Future Appointments  Date Time Provider [place]   07/31/2016 11:00 AM Angelique Blonder [facility]   08/02/2016 11:00 AM Angelique Blonder [facility]   08/06/2016 9:00 AM Kara Dies, PT [facility]   08/08/2016 8:30 AM Kara Dies, PT [facility]   08/09/2016 10:30 AM Jeannette How, PT [facility]   08/13/2016 9:00 AM Kara Dies, PT [facility]   08/15/2016 8:00 AM Kara Dies, PT [facility]    08/16/2016 9:00 AM Jeannette How, PT [facility]   08/19/2016 8:00 AM Kara Dies, PT [facility]   08/21/2016 9:00 AM Jeannette How, PT [facility]   08/23/2016 8:00 AM Kara Dies, PT [facility]   08/26/2016 8:00 AM Kara Dies, PT [facility]   08/28/2016 9:00 AM Jeannette How, PT [facility]   08/30/2016 8:00 AM Kara Dies, PT [facility]

## 2016-08-02 ENCOUNTER — Inpatient Hospital Stay: Admit: 2016-08-02 | Payer: MEDICARE | Primary: Family Medicine

## 2016-08-02 DIAGNOSIS — M25561 Pain in right knee: Secondary | ICD-10-CM | POA: Diagnosis not present

## 2016-08-02 NOTE — Progress Notes (Signed)
PT DAILY TREATMENT NOTE - MCR 12-16    Patient Name: Jason Nixon  Date:08/02/2016  DOB: 1940/09/12  '[x]'   Patient DOB Verified  Payor: [place] MEDICARE / Plan: VA MEDICARE PART A & B / Product Type: Medicare /    In time: 10:55  Out time: 12:05  Total Treatment Time (min): 70  Total Timed Codes (min): 60  1:1 Treatment Time ([place] only): 60  Visit #: 12  of 24     Treatment Area: Pain in right knee [M25.561]    SUBJECTIVE  Pain Level (0-10 scale): 1-2/10  Any medication changes, allergies to medications, adverse drug reactions, diagnosis change, or new procedure performed?: '[x]'  No    '[]'  Yes (see summary sheet for update)  Subjective functional status/changes:   '[]'  No changes reported  Pt has no new complaints at this time. Notes that his knee continues to feel well and states walking is getting easier.     OBJECTIVE    Modality rationale: decrease pain to improve the patient???s ability to increase ease with ambulation.   Min Type Additional Details    '[]'  Estim:  '[]' Unatt       '[]' IFC  '[]' Premod                        '[]' Other:  '[]' w/ice   '[]' w/heat  Position:  Location:    '[]'  Estim: '[]' Att    '[]' TENS instruct  '[]' NMES                    '[]' Other:  '[]' w/US   '[]' w/ice   '[]' w/heat  Position:  Location:    '[]'   Traction: '[]'  Cervical       '[]' Lumbar                       '[]'  Prone          '[]' Supine                       '[]' Intermittent   '[]' Continuous Lbs:  '[]'  before manual  '[]'  after manual    '[]'   Ultrasound: '[]' Continuous   '[]'  Pulsed                           '[]' 1MHz   '[]' 3MHz W/cm2:  Location:    '[]'   Iontophoresis with dexamethasone         Location: '[]'  Take home patch   '[]'  In clinic   10 '[x]'   Ice     '[]'   heat  '[]'   Ice massage  '[]'   Laser   '[]'   Anodyne Position: Right knee  Location: long sitting    '[]'   Laser with stim  '[]'   Other:  Position:  Location:    '[]'   Vasopneumatic Device Pressure:       '[]'  lo '[]'  med '[]'  hi   Temperature: '[]'  lo '[]'  med '[]'  hi   '[]'  Skin assessment post-treatment:  '[]' intact '[]' redness- no adverse reaction     '[]' redness ??? adverse reaction:       35 min Therapeutic Exercise:  '[x]'  See flow sheet :   Rationale: increase ROM, increase strength and improve coordination to improve the patient???s ability to increase tolerance with functional activities    ??  15 min Neuromuscular Re-education:  '[x]'   See flow sheet : Quad activation and standing balance activities   Rationale: improve coordination, improve  balance and increase proprioception  to improve the patient???s ability to improve ease with ambulation on uneven surfaces and stair navigation    10 min Manual Therapy:  Patellar joint mobs, STM to hamstring, gastroc and quad musculature, knee flexion mobs, knee ent   Rationale: decrease pain, increase ROM and increase tissue extensibility to increase ease with ambulation  Rationale:          With   '[x]'  TE   '[]'  TA   '[]'  neuro   '[]'  other: Patient Education: '[x]'  Review HEP    '[]'  Progressed/Changed HEP based on:   '[x]'  positioning   '[x]'  body mechanics   '[]'  transfers   '[]'  heat/ice application    '[]'  other:          Pain Level (0-10 scale) post treatment: 1/10    ASSESSMENT/Changes in Function:     Pt was able to progress in strength and balance activities today with occasional cues for form. Overall pt is progressing well evident by increased activity tolerance.    Patient will continue to benefit from skilled PT services to modify and progress therapeutic interventions, address functional mobility deficits, address ROM deficits and address strength deficits to attain remaining goals.     '[x]'   See Plan of Care  '[]'   See progress note/recertification  '[]'   See Discharge Summary         Progress towards goals / Updated goals:  Short Term Goals:??To be accomplished in 3??weeks:  1. Pt will be independent and complaint w/ HEP to progress gains in PT.   At eval: reviewed and progressed HEP  Current: Met, HEP performance reported as prescribed, 07/11/2016  2. Pt will have AROM right knee to >??or = to 0-120 deg for normalized gait.    At eval: AROM right knee 7-91   Current: Progressing,??AROM Right knee 0-115, 07/24/16  Current: progresing, AROM right knee 0-116, 07/31/16  3. Pt will demo good quad set right w/o lag x10 SLR w/o increased pain for ease w/ curb navigation.   At eval: fair to good quad set, noted fatigue after 3 SLR w/minimal lag present  Current: progressing, fair to good quad set noted, mild lag noted after 8 SLR's w/3# wt??07/30/16  Long Term Goals:??To be accomplished in 8??weeks:  1. Pt will improve FOTO to >??or = to 60 to demo improved function.   At eval: FOTO = 48  Current: Progressing, FOTO = 52??07/19/16  2. Pt will improve MMT right hip and knee to >??or = to 4+-5/5 w/o increased pain for ease w/ (I) community ambulation.   At eval: MMT right hip flex 3+/5 w/ increased pain, abd -4/5, ext 3+/5, knee flex 4/5, ext 4/5  3. Pt will improve balance as demo'd by SLS right for >??or = to 20 seconds w/o HHA for ease w/ stair navigation.   At eval: Requires HHA for SLS > 4 seconds 2' pain  Current: Progressing, Right SLS = 9 sec (no HHA), 07/17/2016  4. Pt will demo correct ambulation w/o AD for >??or = to 557f for ease w/ community navigation.   At eval: Pt presents w/ antalgic gait and SPC left hand??  Current: Progressing, Right antalgic gait pattern with decreased right knee flexion during swing phase and decreased right hip extension during push-off with gait without AD, 07/26/2016    PLAN  '[x]'   Upgrade activities as tolerated     '[x]'   Continue plan of care  '[]'   Update interventions per flow sheet       '[]'   Discharge due to:_  '[]'   Other:_      Angelique Blonder 08/02/2016  10:57 AM    Future Appointments  Date Time Provider [place]   08/02/2016 11:00 AM Angelique Blonder [facility]   08/06/2016 9:00 AM Kara Dies, PT [facility]   08/08/2016 8:30 AM Kara Dies, PT [facility]   08/09/2016 10:30 AM Jeannette How, PT [facility]   08/13/2016 9:00 AM Kara Dies, PT [facility]    08/15/2016 8:00 AM Kara Dies, PT [facility]   08/16/2016 9:00 AM Jeannette How, PT [facility]   08/19/2016 8:00 AM Kara Dies, PT [facility]   08/21/2016 9:00 AM Jeannette How, PT [facility]   08/23/2016 8:00 AM Kara Dies, PT [facility]   08/26/2016 8:00 AM Kara Dies, PT [facility]   08/28/2016 9:00 AM Jeannette How, PT [facility]   08/30/2016 8:00 AM Kara Dies, PT [facility]

## 2016-08-06 ENCOUNTER — Inpatient Hospital Stay: Admit: 2016-08-06 | Payer: MEDICARE | Primary: Family Medicine

## 2016-08-06 DIAGNOSIS — M25561 Pain in right knee: Secondary | ICD-10-CM | POA: Diagnosis not present

## 2016-08-06 NOTE — Progress Notes (Signed)
PT DAILY TREATMENT NOTE - MCR 12-16    Patient Name: Jason Nixon  Date:08/06/2016  DOB: 03-19-1940  '[x]'   Patient DOB Verified  Payor: [place] MEDICARE / Plan: [place] MEDICARE PART A & B / Product Type: Medicare /    In [id number]  Out [id number]  Total Treatment Time (min): 52  Total Timed Codes (min): 52  1:1 Treatment Time ([place] only): 43   Visit #: 13 of 24    Treatment Area: Pain in right knee [M25.561]    SUBJECTIVE  Pain Level (0-10 scale): 2  Any medication changes, allergies to medications, adverse drug reactions, diagnosis change, or new procedure performed?: '[x]'  No    '[]'  Yes (see summary sheet for update)  Subjective functional status/changes:   '[]'  No changes reported  Patient reports slight increase in generalized soreness today with completion of yard work yesterday.     OBJECTIVE    25 min Therapeutic Exercise: ??'[x]' ??See flow sheet :??Emphasis placed on improving available knee AROM and improving strength of the LE musculature   Rationale: increase ROM and increase strength??to improve the patient???s ability to improve ease with household ambulation  ????  17 min Neuromuscular Re-education: ??'[x]' ????See flow sheet :??Emphasis placed on improving activation and recruitment of the quadriceps and gluteal musculature and improving LE proprioceptive and kinesthetic awareness   Rationale:??increase ROM, increase strength, improve balance and increase proprioception????to improve the patient???s ability to improve safety with community ADLs  ????  10 min Manual Therapy: ??  Prone, Right Hip PA Grade III-IV Mobilization (w/ quadriceps manual stretch)  Prone, Right Hip PA Grade III-IV Mobilization (FABER position)  Supine, Right Femur AP Grade II-III Mobilization (terminal knee ext)  Supine, Right Knee Manually-Resisted TKE   Rationale:??decrease pain, increase ROM and increase tissue extensibility??to improve ease with stair management.         With   '[]'  TE   '[]'  TA   '[]'  neuro   '[]'  other: Patient Education: '[x]'  Review HEP     '[]'  Progressed/Changed HEP based on:   '[]'  positioning   '[]'  body mechanics   '[]'  transfers   '[]'  heat/ice application    '[]'  other:      Pain Level (0-10 scale) post treatment: 1    ASSESSMENT/Changes in Function: Patient demonstrates right knee AROM 0-120 degrees, pre-manual, but continues to demonstrate end-range extension tightness with continued tendency to externally rotate the femur with TKE AROM. Completion of mini squat with use of Airex to promote stability throughout increasing degrees of knee flexion on unstable surfaces.    Patient will continue to benefit from skilled PT services to modify and progress therapeutic interventions, address functional mobility deficits, address ROM deficits, address strength deficits, analyze and address soft tissue restrictions, analyze and cue movement patterns, analyze and modify body mechanics/ergonomics and assess and modify postural abnormalities to attain remaining goals.     '[]'   See Plan of Care  '[]'   See progress note/recertification  '[]'   See Discharge Summary         Progress towards goals / Updated goals:    Short Term Goals:??To be accomplished in 3??weeks:  1. Pt will be independent and complaint w/ HEP to progress gains in PT.   At eval: reviewed and progressed HEP  Current: Met, HEP performance reported as prescribed, 07/11/2016  2. Pt will have AROM right knee to >??or = to 0-120 deg for normalized gait.   At eval: AROM right knee 7-91   Current: Progresing, Right Knee AROM 0-120  degrees, 08/06/2016  3. Pt will demo good quad set right w/o lag x10 SLR w/o increased pain for ease w/ curb navigation.   At eval: fair to good quad set, noted fatigue after 3 SLR w/minimal lag present  Current: Progressing, fair to good quad set noted, mild lag noted after 8 SLR's w/3# wt,??07/30/16  Long Term Goals:??To be accomplished in 8??weeks:  1. Pt will improve FOTO to >??or = to 60 to demo improved function.   At eval: FOTO = 48  Current: Progressing, FOTO = 52??07/19/16   2. Pt will improve MMT right hip and knee to >??or = to 4+-5/5 w/o increased pain for ease w/ (I) community ambulation.   At eval: MMT right hip flex 3+/5 w/ increased pain, abd -4/5, ext 3+/5, knee flex 4/5, ext 4/5   3. Pt will improve balance as demo'd by SLS right for >??or = to 20 seconds w/o HHA for ease w/ stair navigation.   At eval: Requires HHA for SLS > 4 seconds 2' pain  Current: Progressing, Right SLS = 9 sec (no HHA), 07/17/2016  4. Pt will demo correct ambulation w/o AD for >??or = to 548f for ease w/ community navigation.   At eval: Pt presents w/ antalgic gait and SPC left hand??  Current: Progressing, Right antalgic gait pattern with decreased right knee flexion during swing phase and decreased right hip extension during push-off with gait without AD, 07/26/2016    PLAN  '[x]'   Upgrade activities as tolerated     '[x]'   Continue plan of care  '[]'   Update interventions per flow sheet       '[]'   Discharge due to:_  '[]'   Other:_      VKara Dies PT 08/06/2016  8:22 AM    Future Appointments  Date [place]  08/06/2016 9:00 AM [facility]  08/08/2016 8:30 AM [facility]  08/09/2016 10:30 AM [facility]annette How [facility]  08/13/2016 9:00 AM [facility]  08/15/2016 8:00 AM [facility]  08/16/2016 9:00 AM [facility]  08/19/2016 8:00 AM [facility]  08/21/2016 9:00 AM [facility]  08/23/2016 8:00 AM [facility]  08/26/2016 8:00 AM [facility]  08/28/2016 9:00 AM [facility]  08/30/2016 8:00 AM [facility]ara Dies PT [facility]

## 2016-08-08 ENCOUNTER — Inpatient Hospital Stay: Admit: 2016-08-08 | Payer: MEDICARE | Primary: Family Medicine

## 2016-08-08 DIAGNOSIS — M25561 Pain in right knee: Secondary | ICD-10-CM | POA: Diagnosis not present

## 2016-08-08 NOTE — Progress Notes (Signed)
PT DAILY TREATMENT NOTE - MCR 12-16    Patient Name: Jason Nixon  Date:08/08/2016  DOB: 1940-09-25  '[x]'   Patient DOB Verified  Payor: [place] MEDICARE / Plan: VA MEDICARE PART A & B / Product Type: Medicare /    In [id number]  Out time:0913  Total Treatment Time (min): 46  Total Timed Codes (min): 46  1:1 Treatment Time ([facility] only): 25   Visit #: 14 of 24    Treatment Area: Pain in right knee [M25.561]    SUBJECTIVE  Pain Level (0-10 scale): 0  Any medication changes, allergies to medications, adverse drug reactions, diagnosis change, or new procedure performed?: '[x]'  No    '[]'  Yes (see summary sheet for update)  Subjective functional status/changes:   '[]'  No changes reported  Patient reports reduced stiffness this AM.     OBJECTIVE    20 min Therapeutic Exercise: ??'[x]' ??See flow sheet :??Emphasis placed on improving available knee AROM and improving strength of the LE musculature   Rationale: increase ROM and increase strength??to improve the patient???s ability to improve ease with household ambulation  ????  18 min Neuromuscular Re-education: ??'[x]' ????See flow sheet :??Emphasis placed on improving activation and recruitment of the quadriceps and gluteal musculature and improving LE proprioceptive and kinesthetic awareness   Rationale:??increase ROM, increase strength, improve balance and increase proprioception????to improve the patient???s ability to improve safety with community ADLs  ????  8 min Manual Therapy: ??  Prone, Right Hip PA Grade III-IV Mobilization (w/ quadriceps manual stretch)  Prone, Right Hip PA Grade III-IV Mobilization (FABER position)  Supine, Right Femur AP Grade II-III Mobilization (terminal knee ext)  Supine, Right Knee Manually-Resisted TKE   Rationale:??decrease pain, increase ROM and increase tissue extensibility??to improve ease with stair management.        With   '[]'  TE   '[]'  TA   '[]'  neuro   '[]'  other: Patient Education: '[x]'  Review HEP    '[]'  Progressed/Changed HEP based on:    '[]'  positioning   '[]'  body mechanics   '[]'  transfers   '[]'  heat/ice application    '[]'  other:      Pain Level (0-10 scale) post treatment: 0    ASSESSMENT/Changes in Function: Improved quadriceps control demonstrated with completion of supine SLR with slight quadriceps lag demonstrated with eccentric lowering with end-range of motion. Initiated completion of supine bridges to improve gluteal strength and recruitment with continued anterior hip hypomobility and hip extensor weakness noted.    Patient will continue to benefit from skilled PT services to modify and progress therapeutic interventions, address functional mobility deficits, address ROM deficits, address strength deficits, analyze and address soft tissue restrictions, analyze and cue movement patterns, analyze and modify body mechanics/ergonomics and assess and modify postural abnormalities to attain remaining goals.     '[]'   See Plan of Care  '[]'   See progress note/recertification  '[]'   See Discharge Summary         Progress towards goals / Updated goals:    Short Term Goals:??To be accomplished in 3??weeks:  1. Pt will be independent and complaint w/ HEP to progress gains in PT.   At eval: reviewed and progressed HEP  Current: Met, HEP performance reported as prescribed, 07/11/2016  2. Pt will have AROM right knee to >??or = to 0-120 deg for normalized gait.   At eval: AROM right knee 7-91   Current: Progresing, Right Knee AROM 0-120 degrees, 08/06/2016  3. Pt will demo good quad set right w/o lag  x10 SLR w/o increased pain for ease w/ curb navigation.   At eval: fair to good quad set, noted fatigue after 3 SLR w/minimal lag present  Current: Progressing, fair to good quad set noted, mild lag noted after 8 SLR's w/3# wt,??07/30/16  Long Term Goals:??To be accomplished in 8??weeks:  1. Pt will improve FOTO to >??or = to 60 to demo improved function.   At eval: FOTO = 48  Current: Progressing, FOTO = 52??07/19/16   2. Pt will improve MMT right hip and knee to >??or = to 4+-5/5 w/o increased pain for ease w/ (I) community ambulation.   At eval: MMT right hip flex 3+/5 w/ increased pain, abd -4/5, ext 3+/5, knee flex 4/5, ext 4/5   3. Pt will improve balance as demo'd by SLS right for >??or = to 20 seconds w/o HHA for ease w/ stair navigation.   At eval: Requires HHA for SLS > 4 seconds 2' pain  Current: Progressing, Right SLS = 9 sec (no HHA), 07/17/2016  4. Pt will demo correct ambulation w/o AD for >??or = to 580f for ease w/ community navigation.   At eval: Pt presents w/ antalgic gait and SPC left hand??  Current: Progressing, Right antalgic gait pattern with decreased right knee flexion during swing phase and decreased right hip extension during push-off with gait without AD, 07/26/2016    PLAN  '[]'   Upgrade activities as tolerated     '[]'   Continue plan of care  '[]'   Update interventions per flow sheet       '[]'   Discharge due to:_  '[]'   Other:_      VKara Dies PT 08/08/2016  7:49 AM    Future Appointments  Date Time Provider [place]  08/08/2016 8:30 AM [facility]  08/09/2016 10:30 AM [facility]  08/13/2016 9:00 [facility]  08/15/2016 8:00 AM [facility]  08/16/2016 9:00 AM [facility]  08/19/2016 8:00 AM [facility] Dies [facility]  08/21/2016 9:00 AM [facility]  08/23/2016 8:00 AM [facility] Dies [facility]  08/26/2016 8:00 AM [facility] Dies [facility]  08/28/2016 9:00 AM [facility]  08/30/2016 8:00 AM [facility] Dies [facility]

## 2016-08-09 ENCOUNTER — Encounter: Payer: MEDICARE | Attending: Rehabilitative and Restorative Service Providers" | Primary: Family Medicine

## 2016-08-13 ENCOUNTER — Inpatient Hospital Stay: Admit: 2016-08-13 | Payer: MEDICARE | Primary: Family Medicine

## 2016-08-13 DIAGNOSIS — M25561 Pain in right knee: Secondary | ICD-10-CM | POA: Diagnosis not present

## 2016-08-13 NOTE — Progress Notes (Signed)
PT DAILY TREATMENT NOTE - MCR 12-16    Patient Name: Jason Nixon  Date:08/13/2016  DOB: June 06, 1940  '[x]'   Patient DOB Verified  Payor: [place] MEDICARE / Plan: VA MEDICARE PART A & B / Product Type: Medicare /    In [id number]  Out time:0957  Total Treatment Time (min): 60  Total Timed Codes (min): 50  1:1 Treatment Time ([facility] only): 30   Visit #: 15 of 24    Treatment Area: Pain in right knee [M25.561]    SUBJECTIVE  Pain Level (0-10 scale): 0  Any medication changes, allergies to medications, adverse drug reactions, diagnosis change, or new procedure performed?: '[x]'  No    '[]'  Yes (see summary sheet for update)  Subjective functional status/changes:   '[]'  No changes reported  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    OBJECTIVE    Modality rationale: decrease inflammation and decrease pain to improve the patient???s ability to improve ease with sleep   Min Type Additional Details   10 '[x]'   Ice     '[]'   heat  '[]'   Ice massage Position: Reclined  Location: Right Knee, Post-Tx     20 min Therapeutic Exercise: ??'[x]' ??See flow sheet :??Emphasis placed on improving available knee AROM and improving strength of the LE musculature   Rationale: increase ROM and increase strength??to improve the patient???s ability to improve ease with household ambulation  ????  22 min Neuromuscular Re-education: ??'[x]' ????See flow sheet :??Emphasis placed on improving activation and recruitment of the quadriceps and gluteal musculature and improving LE proprioceptive and kinesthetic awareness   Rationale:??increase ROM, increase strength, improve balance and increase proprioception????to improve the patient???s ability to improve safety with community ADLs  ????  8 min Manual Therapy: ??  Prone, Right Hip PA Grade III-IV Mobilization (FABER position)  Supine, Right Femur AP Grade II-III Mobilization (terminal knee ext)  Supine, Right Knee Manually-Resisted TKE  Supine, Manually Resisted Right Hip Extension    Rationale:??decrease pain, increase ROM and increase tissue extensibility??to improve ease with stair management.         With   '[]'  TE   '[]'  TA   '[]'  neuro   '[]'  other: Patient Education: '[x]'  Review HEP    '[]'  Progressed/Changed HEP based on:   '[]'  positioning   '[]'  body mechanics   '[]'  transfers   '[]'  heat/ice application    '[]'  other:      Pain Level (0-10 scale) post treatment: 0    ASSESSMENT/Changes in Function: Patient continues to demonstrate good objective and subjective functional improvements with patient to be discharged following completion of scheduled treatment sessions 08/21/2016. Patient denies any functional limitations but continues to demonstrate end-range extension stiffness with limited hip extension during terminal stance phase.    Patient will continue to benefit from skilled PT services to modify and progress therapeutic interventions, address functional mobility deficits, address ROM deficits, address strength deficits, analyze and address soft tissue restrictions, analyze and cue movement patterns, analyze and modify body mechanics/ergonomics and assess and modify postural abnormalities to attain remaining goals.     '[]'   See Plan of Care  '[]'   See progress note/recertification  '[]'   See Discharge Summary         Progress towards goals / Updated goals:    Short Term Goals:??To be accomplished in 3??weeks:  1. Pt will be independent and complaint w/ HEP to progress gains in PT.   At eval: reviewed and progressed HEP  Current: Met,  HEP performance reported as prescribed, 07/11/2016  2. Pt will have AROM right knee to >??or = to 0-120 deg for normalized gait.   At eval: AROM right knee 7-91   Current: Progresing, Right Knee AROM 0-120 degrees, 08/06/2016  3. Pt will demo good quad set right w/o lag x10 SLR w/o increased pain for ease w/ curb navigation.   At eval: fair to good quad set, noted fatigue after 3 SLR w/minimal lag present  Current: Met, Supine SLR x10 without quadriceps lag, 08/13/2016   Long Term Goals:??To be accomplished in 8??weeks:  1. Pt will improve FOTO to >??or = to 60 to demo improved function.   At eval: FOTO = 48  Current: Met, FOTO = 70,??08/13/16  2. Pt will improve MMT right hip and knee to >??or = to 4+-5/5 w/o increased pain for ease w/ (I) community ambulation.   At eval: MMT right hip flex 3+/5 w/ increased pain, abd -4/5, ext 3+/5, knee flex 4/5, ext 4/5??  3. Pt will improve balance as demo'd by SLS right for >??or = to 20 seconds w/o HHA for ease w/ stair navigation.   At eval: Requires HHA for SLS > 4 seconds 2' pain  Current: Progressing, Right SLS = 9 sec (no HHA), 07/17/2016  4. Pt will demo correct ambulation w/o AD for >??or = to 556f for ease w/ community navigation.   At eval: Pt presents w/ antalgic gait and SPC left hand??  Current: Progressing, Right antalgic gait pattern with decreased right knee flexion during swing phase and decreased right hip extension during push-off with gait without AD, 07/26/2016    PLAN  '[x]'   Upgrade activities as tolerated     '[x]'   Continue plan of care  '[]'   Update interventions per flow sheet       '[]'   Discharge due to:_  '[]'   Other:_      VKara Dies PT 08/13/2016  8:23 AM    Future Appointments  Date Time Provider [place]  08/13/2016 9:00 AM VKara Dies PT [facility]  08/15/2016 8:00 AM [facility] Dies [facility]  08/16/2016 9:00 AM [facility]  08/19/2016 8:00 AM [facility]  08/21/2016 9:00 AM [facility]  08/23/2016 8:00 [facility]  08/26/2016 8:00 AM [facility] Dies [facility]  08/28/2016 10:30 AM HAngelique BlonderMM[facility]TS MWilson  08/30/2016 8:00 AM [facility] Dies PT [facility]

## 2016-08-15 ENCOUNTER — Inpatient Hospital Stay: Admit: 2016-08-15 | Payer: MEDICARE | Primary: Family Medicine

## 2016-08-15 DIAGNOSIS — M25561 Pain in right knee: Secondary | ICD-10-CM | POA: Diagnosis not present

## 2016-08-15 NOTE — Progress Notes (Signed)
PT DAILY TREATMENT NOTE - MCR 12-16    Patient Name: Jason Nixon  Date:08/15/2016  DOB: 10/15/1940  '[x]'   Patient DOB Verified  Payor: [place] MEDICARE / Plan: VA MEDICARE PART A & B / Product Type: Medicare /    In time:0757  Out AGTX:[id number]  Total Treatment Time (min): 59  Total Timed Codes (min): 49  1:1 Treatment Time ([facility] only): 74   Visit #: 16 of 24    Treatment Area: Pain in right knee [M25.561]    SUBJECTIVE  Pain Level (0-10 scale): 0  Any medication changes, allergies to medications, adverse drug reactions, diagnosis change, or new procedure performed?: '[x]'  No    '[]'  Yes (see summary sheet for update)  Subjective functional status/changes:   '[]'  No changes reported  Patient reports a few episodes of knee buckling yesterday with patient having completed a lot of prolonged weightbearing activities over the last couple of days.    OBJECTIVE    Modality rationale: decrease inflammation and decrease pain to improve the patient???s ability to improve ease with sleep   Min Type Additional Details   10 '[x]'   Ice     '[]'   heat  '[]'   Ice massage Position: Reclined  Location: Right Knee, Post-Tx     21 min Therapeutic Exercise: ??'[x]' ??See flow sheet :??Emphasis placed on improving available knee AROM and improving strength of the LE musculature   Rationale: increase ROM and increase strength??to improve the patient???s ability to improve ease with household ambulation  ????  20 min Neuromuscular Re-education: ??'[x]' ????See flow sheet :??Emphasis placed on improving activation and recruitment of the quadriceps and gluteal musculature and improving LE proprioceptive and kinesthetic awareness   Rationale:??increase ROM, increase strength, improve balance and increase proprioception????to improve the patient???s ability to improve safety with community ADLs  ????  8 min Manual Therapy: ??  Prone, Right Hip PA Grade III-IV Mobilization (FABER position)  Supine, Right Femur AP Grade II-III Mobilization (terminal knee ext)   Supine, Right Knee Manually-Resisted TKE   Rationale:??decrease pain, increase ROM and increase tissue extensibility??to improve ease with stair management.         With   '[]'  TE   '[]'  TA   '[]'  neuro   '[]'  other: Patient Education: '[x]'  Review HEP    '[]'  Progressed/Changed HEP based on:   '[]'  positioning   '[]'  body mechanics   '[]'  transfers   '[]'  heat/ice application    '[]'  other:      Pain Level (0-10 scale) post treatment: 0    ASSESSMENT/Changes in Function: With patient with continued minimal-to-moderate non-pitting edema noted localized to the right knee patient educated regarding the importance of regular ice and elevation for edema management with continued end-range extension stiffness noted.    Patient will continue to benefit from skilled PT services to modify and progress therapeutic interventions, address functional mobility deficits, address ROM deficits, address strength deficits, analyze and address soft tissue restrictions, analyze and cue movement patterns, analyze and modify body mechanics/ergonomics and assess and modify postural abnormalities to attain remaining goals.     '[]'   See Plan of Care  '[]'   See progress note/recertification  '[]'   See Discharge Summary         Progress towards goals / Updated goals:    Short Term Goals:??To be accomplished in 3??weeks:  1. Pt will be independent and complaint w/ HEP to progress gains in PT.   At eval: reviewed and progressed HEP  Current: Met, HEP performance reported  as prescribed, 07/11/2016  2. Pt will have AROM right knee to >??or = to 0-120 deg for normalized gait.   At eval: AROM right knee 7-91   Current: Progresing, Right Knee AROM 0-120 degrees, 08/06/2016  3. Pt will demo good quad set right w/o lag x10 SLR w/o increased pain for ease w/ curb navigation.   At eval: fair to good quad set, noted fatigue after 3 SLR w/minimal lag present  Current: Met, Supine SLR x10 without quadriceps lag, 08/13/2016  Long Term Goals:??To be accomplished in 8??weeks:   1. Pt will improve FOTO to >??or = to 60 to demo improved function.   At eval: FOTO = 48  Current: Met, FOTO = 70,??08/13/16  2. Pt will improve MMT right hip and knee to >??or = to 4+-5/5 w/o increased pain for ease w/ (I) community ambulation.   At eval: MMT right hip flex 3+/5 w/ increased pain, abd -4/5, ext 3+/5, knee flex 4/5, ext 4/5??  3. Pt will improve balance as demo'd by SLS right for >??or = to 20 seconds w/o HHA for ease w/ stair navigation.   At eval: Requires HHA for SLS > 4 seconds 2' pain  Current: Progressing, Right SLS = 9 sec (no HHA), 07/17/2016  4. Pt will demo correct ambulation w/o AD for >??or = to 566f for ease w/ community navigation.   At eval: Pt presents w/ antalgic gait and SPC left hand??  Current: Progressing, Right antalgic gait pattern with decreased right knee flexion during swing phase and decreased right hip extension during push-off with gait without AD, 07/26/2016    PLAN  '[x]'   Upgrade activities as tolerated     '[x]'   Continue plan of care  '[]'   Update interventions per flow sheet       '[]'   Discharge due to:_  '[]'   Other:_      VKara Dies PT 08/15/2016  7:49 AM    Future Appointments  Date Time Provider DGarn[place]  08/15/2016 8:00 AM [facility] Dies [facility]  08/19/2016 8:00 AM [facility] Dies [facility]  08/21/2016 9:00 AM [facility]ette How [facility]

## 2016-08-16 ENCOUNTER — Encounter: Payer: MEDICARE | Attending: Rehabilitative and Restorative Service Providers" | Primary: Family Medicine

## 2016-08-19 ENCOUNTER — Inpatient Hospital Stay: Admit: 2016-08-19 | Payer: MEDICARE | Primary: Family Medicine

## 2016-08-19 DIAGNOSIS — M25561 Pain in right knee: Secondary | ICD-10-CM | POA: Diagnosis not present

## 2016-08-19 NOTE — Progress Notes (Signed)
PT DAILY TREATMENT NOTE - MCR 12-16    Patient Name: Jason Nixon  Date:08/19/2016  DOB: 1940/04/22  '[x]'   Patient DOB Verified  [place] / Plan: [place] B / Product Type: Medicare /    In FYBO:1751  Out WCHE:5277  Total Treatment Time (min): 53  Total Timed Codes (min): 43  1:1 Treatment Time ([place] only): 30   Visit #: 17 of 24    Treatment Area: Pain in right knee [M25.561]    SUBJECTIVE  Pain Level (0-10 scale): 0  Any medication changes, allergies to medications, adverse drug reactions, diagnosis change, or new procedure performed?: '[x]'  No    '[]'  Yes (see summary sheet for update)  Subjective functional status/changes:   '[]'  No changes reported  Patient reports no functional limitations.    OBJECTIVE    Modality rationale: decrease inflammation and decrease pain to improve the patient???s ability to improve ease with sleep   Min Type Additional Details   10 '[x]'   Ice     '[]'   heat  '[]'   Ice massage Position: Reclined  Location: Right Knee, Post-Tx     20 min Therapeutic Exercise: ??'[x]' ??See flow sheet :??Emphasis placed on improving available knee AROM and improving strength of the LE musculature   Rationale: increase ROM and increase strength??to improve the patient???s ability to improve ease with household ambulation  ????  23 min Neuromuscular Re-education: ??'[x]' ????See flow sheet :??Emphasis placed on improving activation and recruitment of the quadriceps and gluteal musculature and improving LE proprioceptive and kinesthetic awareness   Rationale:??increase ROM, increase strength, improve balance and increase proprioception????to improve the patient???s ability to improve safety with community ADLs  ????      With   '[]'  TE   '[]'  TA   '[]'  neuro   '[]'  other: Patient Education: '[x]'  Review HEP    '[]'  Progressed/Changed HEP based on:   '[]'  positioning   '[]'  body mechanics   '[]'  transfers   '[]'  heat/ice application    '[]'  other:      Pain Level (0-10 scale) post treatment: 0     ASSESSMENT/Changes in Function: Patient to be discharged following completion of next scheduled treatment session 08/21/2016 with patient demonstrating right knee AROM 0-118 degrees and subjectively without any functional limitations nor activity restrictions. Continued reduced eccentric quadriceps control noted with step downs.    Patient will continue to benefit from skilled PT services to modify and progress therapeutic interventions, address functional mobility deficits, address ROM deficits, address strength deficits, analyze and address soft tissue restrictions, analyze and cue movement patterns, analyze and modify body mechanics/ergonomics and assess and modify postural abnormalities to attain remaining goals.     '[]'   See Plan of Care  '[]'   See progress note/recertification  '[]'   See Discharge Summary         Progress towards goals / Updated goals:    Short Term Goals:??To be accomplished in 3??weeks:  1. Pt will be independent and complaint w/ HEP to progress gains in PT.   At eval: reviewed and progressed HEP  Current: Met, HEP performance reported as prescribed, 07/11/2016  2. Pt will have AROM right knee to >??or = to 0-120 deg for normalized gait.   At eval: AROM right knee 7-91   Current: Met, Right Knee AROM 0-120 degrees, 08/19/2016  3. Pt will demo good quad set right w/o lag x10 SLR w/o increased pain for ease w/ curb navigation.   At eval: fair to  good quad set, noted fatigue after 3 SLR w/minimal lag present  Current: Met, Supine SLR x10 without quadriceps lag, 08/13/2016  Long Term Goals:??To be accomplished in 8??weeks:  1. Pt will improve FOTO to >??or = to 60 to demo improved function.   At eval: FOTO = 48  Current: Met, FOTO = 70,??08/13/16  2. Pt will improve MMT right hip and knee to >??or = to 4+-5/5 w/o increased pain for ease w/ (I) community ambulation.   At eval: MMT right hip flex 3+/5 w/ increased pain, abd -4/5, ext 3+/5, knee flex 4/5, ext 4/5??   3. Pt will improve balance as demo'd by SLS right for >??or = to 20 seconds w/o HHA for ease w/ stair navigation.   At eval: Requires HHA for SLS > 4 seconds 2' pain  Current: Progressing, Right SLS = 9 sec (no HHA), 07/17/2016  4. Pt will demo correct ambulation w/o AD for >??or = to 548f for ease w/ community navigation.   At eval: Pt presents w/ antalgic gait and SPC left hand??  Current: Progressing, Right antalgic gait pattern with decreased right knee flexion during swing phase and decreased right hip extension during push-off with gait without AD, 07/26/2016    PLAN  '[x]'   Upgrade activities as tolerated     '[x]'   Continue plan of care  '[]'   Update interventions per flow sheet       '[]'   Discharge due to:_  '[]'   Other:_      VKara Dies PT 08/19/2016  6:50 AM    Future Appointments  Date Time Provider [place]  08/19/2016 8:00 AM [facility]  08/21/2016 9:00 AM [facility]

## 2016-08-21 ENCOUNTER — Inpatient Hospital Stay
Admit: 2016-08-21 | Payer: MEDICARE | Attending: Rehabilitative and Restorative Service Providers" | Primary: Family Medicine

## 2016-08-21 DIAGNOSIS — M25561 Pain in right knee: Secondary | ICD-10-CM | POA: Diagnosis not present

## 2016-08-21 NOTE — Progress Notes (Signed)
PT DAILY TREATMENT NOTE - MCR 12-16    Patient Name: Jason Nixon  Date:08/21/2016  DOB: 03-16-1940  _0   Patient DOB Verified  [place]or: [place] / Plan: [place] MEDICARE PART A & B / Product Type: Medicare /    In time:852  Out time:945  Total Treatment Time (min): 53  Total Timed Codes (min): 43  1:1 Treatment Time ([place] only): 13   Visit #: 18 of 24    Treatment Area: Pain in right knee [M25.561]    SUBJECTIVE  Pain Level (0-10 scale): 0/10  Any medication changes, allergies to medications, adverse drug reactions, diagnosis change, or new procedure performed?: _1  No    _2  Yes (see summary sheet for update)  Subjective functional status/changes:   _3  No changes reported  Pt reports no limitations at home and no pain in the knee    OBJECTIVE    Modality rationale: decrease edema, decrease inflammation and decrease pain to improve the patient???s ability to improve post workout soreness    Min Type Additional Details    _4  Estim:  _5 Unatt       _6 IFC  _7 Premod                        _8 Other:  _9 w/ice   _10 w/heat  Position:  Location:    _11  Estim: _12 Att    _13 TENS instruct  _14 NMES                    _15 Other:  _16 w/US   _17 w/ice   _18 w/heat  Position:  Location:    _19   Traction: _20  Cervical       _21 Lumbar                       _22  Prone          _23 Supine                       _24 Intermittent   _25 Continuous Lbs:  _26  before manual  _27  after manual    _28   Ultrasound: _29 Continuous   _30  Pulsed                           _31 1MHz   _32 3MHz W/cm2:  Location:    _33   Iontophoresis with dexamethasone         Location: _34  Take home patch   _35  In clinic   10 _36   Ice     _37   heat  _38   Ice massage  _39   Laser   _40   Anodyne Position: semi reclined  Location: right knee    _41   Laser with stim  _42   Other:  Position:  Location:    _43   Vasopneumatic Device Pressure:       _44  lo _45  med _46  hi   Temperature: _47  lo _48  med _49  hi   _50  Skin assessment post-treatment:  _51 intact _52 redness- no adverse reaction    _53 redness ??? adverse reaction:       min _54 Eval                  _55 Re-Eval       33 min Therapeutic Exercise:  _56  See flow sheet : increased per flow sheet    Rationale: increase ROM, increase strength and improve coordination to improve the patient???s ability to improve activity tolerance and mobility      min  Therapeutic Activity:  []  See flow sheet :   Rationale:   to improve the patient???s ability to      10 min Neuromuscular Re-education:  [x]  See flow sheet : balance and quad activation ex's per flow sheet   Rationale: increase strength, improve coordination, improve balance and increase proprioception  to improve the patient???s ability to improve activity tolerance and mobility      min Manual Therapy:     Rationale:  to      min Gait Training:  ___ feet with ___ device on level surfaces with ___ level of assist   Rationale:          With   [] TE   [] TA   [] neuro   [] other: Patient Education: [x] Review HEP    [] Progressed/Changed HEP based on:   [] positioning   [] body mechanics   [] transfers   [] heat/ice application    [] other:      Other Objective/Functional Measures: see below     Pain Level (0-10 scale) post treatment: 0/10    ASSESSMENT/Changes in Function: Pt has made great progress with PT and is reporting no restrictions at this time. Pt's AROM and strength are WNL. DC to HEP at this time.      []  See Plan of Care  []  See progress note/recertification  [x]  See Discharge Summary         Progress towards goals / Updated goals:  Short Term Goals:??To be accomplished in 3??weeks:  1. Pt will be independent and complaint w/ HEP to progress gains in PT.   At eval: reviewed and progressed HEP  Current: Met, HEP performance reported as prescribed, 07/11/2016  2. Pt will have AROM right knee to >??or = to 0-120 deg for normalized gait.   At eval: AROM right knee 7-91   Current: Met, Right Knee AROM 0-120 degrees, 08/19/2016  3. Pt will demo good quad set right w/o lag x10 SLR w/o increased pain for ease w/ curb navigation.    At eval: fair to good quad set, noted fatigue after 3 SLR w/minimal lag present  Current: Met, Supine SLR x10 without quadriceps lag, 08/13/2016  Long Term Goals:??To be accomplished in 8??weeks:  1. Pt will improve FOTO to >??or = to 60 to demo improved function.   At eval: FOTO = 48  Current: Met, FOTO = 70,??08/13/16  2. Pt will improve MMT right hip and knee to >??or = to 4+-5/5 w/o increased pain for ease w/ (I) community ambulation.   At eval: MMT right hip flex 3+/5 w/ increased pain, abd -4/5, ext 3+/5, knee flex 4/5, ext 4/5  Current: met,  MMT right hip flex 4+/5, abd 4+/5, ext 4+/5, knee flex 5/5, ext 4+/5 08/21/16??  3. Pt will improve balance as demo'd by SLS right for >??or = to 20 seconds w/o HHA for ease w/ stair navigation.   At eval: Requires HHA for SLS > 4 seconds 2' pain  Current: progressing, Right SLS = 11 sec (no HHA), 08/21/2016  4. Pt will demo correct ambulation w/o AD for >??or = to 517f for ease w/ community navigation.   At eval: Pt presents w/ antalgic gait and SPC left hand??  Current: met, pt w/ normal heel toe gait pattern w/ min VC's and no AD for community distances 08/21/16    PLAN  []  Upgrade activities as tolerated     []  Continue plan of care  []  Update interventions per flow sheet       []  Discharge due to:_  []  Other:_      Jeannette How, PT 08/21/2016  9:10 AM    No future appointments.

## 2016-08-21 NOTE — Progress Notes (Signed)
[facility] ??? [facility]              [address] [place] [address]  [phone]   [phone] fax      Discharge Summary  Patient name: Jason Nixon Start of Care: 07/09/16   Referral source: Marshell Garfinkel, * DOB: 1940/10/03   Medical/Treatment Diagnosis: Pain in right knee [M25.561] Onset Date:06/11/16     Prior Hospitalization: see medical history Provider#: [id number]   Medications: Verified on Patient Summary List    Comorbidities: arthritis, BMI > 30, CA, DM, HBP, sleep dysfunction    Prior Level of Function: (I) w/ gait, pain in right knee, hx left TKR, lives w/ wife, no limitations w/ MADLs  Visits from Start of Care: 18    Missed Visits: 0  Reporting Period : 07/09/16 to 08/21/16      Summary of Care:   Progress towards goals / Updated goals:  Short Term Goals:??To be accomplished in 3??weeks:  1. Pt will be independent and complaint w/ HEP to progress gains in PT.   At eval: reviewed and progressed HEP  Current: Met, HEP performance reported as prescribed  2. Pt will have AROM right knee to >??or = to 0-120 deg for normalized gait.   At eval: AROM right knee 7-91   Current: Met, Right Knee AROM 0-120 degrees  3. Pt will demo good quad set right w/o lag x10 SLR w/o increased pain for ease w/ curb navigation.   At eval: fair to good quad set, noted fatigue after 3 SLR w/minimal lag present  Current: Met, Supine SLR x10 without quadriceps lag  Long Term Goals:??To be accomplished in 8??weeks:  1. Pt will improve FOTO to >??or = to 60 to demo improved function.   At eval: FOTO = 48  Current: Met, FOTO = 70  2. Pt will improve MMT right hip and knee to >??or = to 4+-5/5 w/o increased pain for ease w/ (I) community ambulation.   At eval: MMT right hip flex 3+/5 w/ increased pain, abd -4/5, ext 3+/5, knee flex 4/5, ext 4/5  Current: met,  MMT right hip flex 4+/5, abd 4+/5, ext 4+/5, knee flex 5/5, ext 4+/5    3. Pt will improve balance as demo'd by SLS right for >??or = to 20 seconds w/o HHA for ease w/ stair navigation.   At eval: Requires HHA for SLS > 4 seconds 2' pain  Current: progressing, Right SLS = 11 sec (no HHA)  4. Pt will demo correct ambulation w/o AD for >??or = to 59f for ease w/ community navigation.   At eval: Pt presents w/ antalgic gait and SPC left hand??  Current: met, pt w/ normal heel toe gait pattern w/ min VC's and no AD for community distances     Pt has made great progress with PT and is reporting no restrictions at this time. Pt's AROM and strength are WNL. DC to HEP at this time.     G-Codes (GP)  Mobility   [id number]  CK= 40-59% [id number] D/C  CJ= 20-39%    The severity rating is based on clinical judgment and the FOTO Score score.    ASSESSMENT/RECOMMENDATIONS:  '[x]' Discontinue therapy: '[x]' Patient has reached or is progressing toward set goals      '[]' Patient is non-compliant or has abdicated      '[]' Due to lack of appreciable progress towards set goals  Jeannette How, PT 08/21/2016 9:24 AM

## 2016-08-22 DIAGNOSIS — L578 Other skin changes due to chronic exposure to nonionizing radiation: Secondary | ICD-10-CM | POA: Diagnosis not present

## 2016-08-22 DIAGNOSIS — L570 Actinic keratosis: Secondary | ICD-10-CM | POA: Diagnosis not present

## 2016-08-22 DIAGNOSIS — Z85828 Personal history of other malignant neoplasm of skin: Secondary | ICD-10-CM | POA: Diagnosis not present

## 2016-08-22 DIAGNOSIS — Z08 Encounter for follow-up examination after completed treatment for malignant neoplasm: Secondary | ICD-10-CM | POA: Diagnosis not present

## 2016-08-22 DIAGNOSIS — D485 Neoplasm of uncertain behavior of skin: Secondary | ICD-10-CM | POA: Diagnosis not present

## 2016-08-23 ENCOUNTER — Encounter: Payer: MEDICARE | Primary: Family Medicine

## 2016-08-26 ENCOUNTER — Encounter: Payer: MEDICARE | Primary: Family Medicine

## 2016-08-28 ENCOUNTER — Encounter: Payer: MEDICARE | Primary: Family Medicine

## 2016-08-30 ENCOUNTER — Encounter: Payer: MEDICARE | Primary: Family Medicine

## 2016-09-03 DIAGNOSIS — E119 Type 2 diabetes mellitus without complications: Secondary | ICD-10-CM | POA: Diagnosis not present

## 2016-09-03 DIAGNOSIS — R609 Edema, unspecified: Secondary | ICD-10-CM | POA: Diagnosis not present

## 2016-09-03 DIAGNOSIS — T68XXXA Hypothermia, initial encounter: Secondary | ICD-10-CM | POA: Diagnosis not present

## 2016-09-03 DIAGNOSIS — M1000 Idiopathic gout, unspecified site: Secondary | ICD-10-CM | POA: Diagnosis not present

## 2016-09-03 DIAGNOSIS — E789 Disorder of lipoprotein metabolism, unspecified: Secondary | ICD-10-CM | POA: Diagnosis not present

## 2016-09-11 DIAGNOSIS — M25561 Pain in right knee: Secondary | ICD-10-CM | POA: Diagnosis not present

## 2016-09-27 DIAGNOSIS — D485 Neoplasm of uncertain behavior of skin: Secondary | ICD-10-CM | POA: Diagnosis not present

## 2016-09-27 DIAGNOSIS — R21 Rash and other nonspecific skin eruption: Secondary | ICD-10-CM | POA: Diagnosis not present

## 2016-10-23 DIAGNOSIS — G5603 Carpal tunnel syndrome, bilateral upper limbs: Secondary | ICD-10-CM | POA: Diagnosis not present

## 2016-10-23 DIAGNOSIS — R202 Paresthesia of skin: Secondary | ICD-10-CM | POA: Diagnosis not present

## 2016-10-23 DIAGNOSIS — R200 Anesthesia of skin: Secondary | ICD-10-CM | POA: Diagnosis not present

## 2016-10-23 DIAGNOSIS — G608 Other hereditary and idiopathic neuropathies: Secondary | ICD-10-CM | POA: Diagnosis not present

## 2016-11-04 DIAGNOSIS — G4733 Obstructive sleep apnea (adult) (pediatric): Secondary | ICD-10-CM | POA: Diagnosis not present

## 2016-11-08 DIAGNOSIS — G5603 Carpal tunnel syndrome, bilateral upper limbs: Secondary | ICD-10-CM | POA: Diagnosis not present

## 2016-11-13 DIAGNOSIS — D485 Neoplasm of uncertain behavior of skin: Secondary | ICD-10-CM | POA: Diagnosis not present

## 2016-11-13 DIAGNOSIS — R21 Rash and other nonspecific skin eruption: Secondary | ICD-10-CM | POA: Diagnosis not present

## 2016-11-18 DIAGNOSIS — G5603 Carpal tunnel syndrome, bilateral upper limbs: Secondary | ICD-10-CM | POA: Diagnosis not present

## 2016-12-03 DIAGNOSIS — G4733 Obstructive sleep apnea (adult) (pediatric): Secondary | ICD-10-CM | POA: Diagnosis not present

## 2016-12-05 DIAGNOSIS — C44719 Basal cell carcinoma of skin of left lower limb, including hip: Secondary | ICD-10-CM | POA: Diagnosis not present

## 2016-12-13 DIAGNOSIS — Z23 Encounter for immunization: Secondary | ICD-10-CM | POA: Diagnosis not present

## 2016-12-17 DIAGNOSIS — I10 Essential (primary) hypertension: Secondary | ICD-10-CM | POA: Diagnosis not present

## 2016-12-17 DIAGNOSIS — E119 Type 2 diabetes mellitus without complications: Secondary | ICD-10-CM | POA: Diagnosis not present

## 2016-12-17 DIAGNOSIS — Z7982 Long term (current) use of aspirin: Secondary | ICD-10-CM | POA: Diagnosis not present

## 2016-12-17 DIAGNOSIS — M109 Gout, unspecified: Secondary | ICD-10-CM | POA: Diagnosis not present

## 2016-12-17 DIAGNOSIS — G5602 Carpal tunnel syndrome, left upper limb: Secondary | ICD-10-CM | POA: Diagnosis not present

## 2016-12-17 DIAGNOSIS — E785 Hyperlipidemia, unspecified: Secondary | ICD-10-CM | POA: Diagnosis not present

## 2016-12-17 DIAGNOSIS — Z79899 Other long term (current) drug therapy: Secondary | ICD-10-CM | POA: Diagnosis not present

## 2016-12-27 DIAGNOSIS — E785 Hyperlipidemia, unspecified: Secondary | ICD-10-CM | POA: Diagnosis not present

## 2016-12-27 DIAGNOSIS — M109 Gout, unspecified: Secondary | ICD-10-CM | POA: Diagnosis not present

## 2016-12-27 DIAGNOSIS — I10 Essential (primary) hypertension: Secondary | ICD-10-CM | POA: Diagnosis not present

## 2016-12-27 DIAGNOSIS — Z7982 Long term (current) use of aspirin: Secondary | ICD-10-CM | POA: Diagnosis not present

## 2016-12-27 DIAGNOSIS — G5602 Carpal tunnel syndrome, left upper limb: Secondary | ICD-10-CM | POA: Diagnosis not present

## 2016-12-27 DIAGNOSIS — E119 Type 2 diabetes mellitus without complications: Secondary | ICD-10-CM | POA: Diagnosis not present

## 2017-01-01 DIAGNOSIS — E789 Disorder of lipoprotein metabolism, unspecified: Secondary | ICD-10-CM | POA: Diagnosis not present

## 2017-01-01 DIAGNOSIS — E119 Type 2 diabetes mellitus without complications: Secondary | ICD-10-CM | POA: Diagnosis not present

## 2017-01-08 DIAGNOSIS — G4733 Obstructive sleep apnea (adult) (pediatric): Secondary | ICD-10-CM | POA: Diagnosis not present

## 2017-01-09 DIAGNOSIS — E789 Disorder of lipoprotein metabolism, unspecified: Secondary | ICD-10-CM | POA: Diagnosis not present

## 2017-01-09 DIAGNOSIS — E119 Type 2 diabetes mellitus without complications: Secondary | ICD-10-CM | POA: Diagnosis not present

## 2017-01-09 DIAGNOSIS — Z0000 Encounter for general adult medical examination without abnormal findings: Secondary | ICD-10-CM | POA: Diagnosis not present

## 2017-01-09 DIAGNOSIS — G8929 Other chronic pain: Secondary | ICD-10-CM | POA: Diagnosis not present

## 2017-01-20 DIAGNOSIS — H53001 Unspecified amblyopia, right eye: Secondary | ICD-10-CM | POA: Diagnosis not present

## 2017-01-20 DIAGNOSIS — H50111 Monocular exotropia, right eye: Secondary | ICD-10-CM | POA: Diagnosis not present

## 2017-01-20 DIAGNOSIS — E113292 Type 2 diabetes mellitus with mild nonproliferative diabetic retinopathy without macular edema, left eye: Secondary | ICD-10-CM | POA: Diagnosis not present

## 2017-01-20 DIAGNOSIS — H26493 Other secondary cataract, bilateral: Secondary | ICD-10-CM | POA: Diagnosis not present

## 2017-01-20 DIAGNOSIS — Z961 Presence of intraocular lens: Secondary | ICD-10-CM | POA: Diagnosis not present
